# Patient Record
Sex: Male | Born: 1950
Health system: Southern US, Community
[De-identification: ages and names within clinical notes are randomized; demographics above are authoritative.]

## PROBLEM LIST (undated history)

## (undated) DIAGNOSIS — I10 Essential (primary) hypertension: Secondary | ICD-10-CM

## (undated) DIAGNOSIS — Z960 Presence of urogenital implants: Secondary | ICD-10-CM

## (undated) DIAGNOSIS — E7800 Pure hypercholesterolemia, unspecified: Secondary | ICD-10-CM

## (undated) HISTORY — PX: BRAIN SURGERY: SHX531

## (undated) HISTORY — PX: PROSTATE SURGERY: SHX751

## (undated) HISTORY — PX: OTHER SURGICAL HISTORY: SHX169

## (undated) HISTORY — PX: KIDNEY SURGERY: SHX687

---

## 1998-07-09 ENCOUNTER — Encounter: Admission: RE | Admit: 1998-07-09 | Discharge: 1998-10-07 | Payer: Self-pay | Admitting: Emergency Medicine

## 2001-01-07 ENCOUNTER — Ambulatory Visit (HOSPITAL_COMMUNITY): Admission: RE | Admit: 2001-01-07 | Discharge: 2001-01-07 | Payer: Self-pay | Admitting: Gastroenterology

## 2001-05-20 ENCOUNTER — Encounter: Payer: Self-pay | Admitting: Gastroenterology

## 2001-05-20 ENCOUNTER — Encounter: Admission: RE | Admit: 2001-05-20 | Discharge: 2001-05-20 | Payer: Self-pay | Admitting: Gastroenterology

## 2003-02-16 ENCOUNTER — Encounter (INDEPENDENT_AMBULATORY_CARE_PROVIDER_SITE_OTHER): Payer: Self-pay | Admitting: Specialist

## 2003-02-16 ENCOUNTER — Inpatient Hospital Stay (HOSPITAL_COMMUNITY): Admission: RE | Admit: 2003-02-16 | Discharge: 2003-02-18 | Payer: Self-pay | Admitting: Urology

## 2006-01-10 ENCOUNTER — Encounter (HOSPITAL_COMMUNITY): Admission: RE | Admit: 2006-01-10 | Discharge: 2006-01-15 | Payer: Self-pay | Admitting: Urology

## 2006-11-02 ENCOUNTER — Ambulatory Visit: Admission: RE | Admit: 2006-11-02 | Discharge: 2007-01-25 | Payer: Self-pay | Admitting: Radiation Oncology

## 2006-12-15 ENCOUNTER — Emergency Department (HOSPITAL_COMMUNITY): Admission: EM | Admit: 2006-12-15 | Discharge: 2006-12-15 | Payer: Self-pay | Admitting: Emergency Medicine

## 2007-01-25 ENCOUNTER — Ambulatory Visit: Admission: RE | Admit: 2007-01-25 | Discharge: 2007-02-06 | Payer: Self-pay | Admitting: Radiation Oncology

## 2007-07-07 ENCOUNTER — Observation Stay (HOSPITAL_COMMUNITY): Admission: EM | Admit: 2007-07-07 | Discharge: 2007-07-08 | Payer: Self-pay | Admitting: Emergency Medicine

## 2007-10-17 ENCOUNTER — Encounter (HOSPITAL_COMMUNITY): Admission: RE | Admit: 2007-10-17 | Discharge: 2007-10-24 | Payer: Self-pay | Admitting: Urology

## 2008-07-17 ENCOUNTER — Emergency Department (HOSPITAL_COMMUNITY): Admission: EM | Admit: 2008-07-17 | Discharge: 2008-07-17 | Payer: Self-pay | Admitting: Emergency Medicine

## 2009-01-29 ENCOUNTER — Emergency Department (HOSPITAL_COMMUNITY): Admission: EM | Admit: 2009-01-29 | Discharge: 2009-01-29 | Payer: Self-pay | Admitting: Emergency Medicine

## 2010-04-11 ENCOUNTER — Other Ambulatory Visit: Payer: Self-pay | Admitting: Gastroenterology

## 2010-05-16 LAB — URINE MICROSCOPIC-ADD ON

## 2010-05-16 LAB — URINE CULTURE
Colony Count: NO GROWTH
Culture: NO GROWTH

## 2010-05-16 LAB — URINALYSIS, ROUTINE W REFLEX MICROSCOPIC
Bilirubin Urine: NEGATIVE
Glucose, UA: NEGATIVE mg/dL
Ketones, ur: NEGATIVE mg/dL
Specific Gravity, Urine: 1.005 (ref 1.005–1.030)
pH: 6.5 (ref 5.0–8.0)

## 2010-06-21 NOTE — H&P (Signed)
Nicholas Maldonado, NIEBLAS NO.:  [id number]   MEDICAL RECORD NO.:  [id number]          PATIENT TYPE:  INP   LOCATION:  3735                         FACILITY:  [facility]   PHYSICIAN:  Peter M. Swaziland, M.D.  DATE OF BIRTH:  Mar 25, 1950   DATE OF ADMISSION:  07/07/2007  DATE OF DISCHARGE:                              HISTORY & PHYSICAL   HISTORY OF PRESENT ILLNESS:  Mr. Misch is a pleasant 60 year old white  male with history of type 2 diabetes mellitus and hypercholesterolemia.  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  There was no other radiation.  He denied any nausea,  vomiting, diaphoresis, or shortness of breath.  His pain lasted over 30  minutes and then gradually subsided and resolved.  He is now pain free.  He has no known history of chest pain or cardiac disease.   PAST MEDICAL HISTORY:  1. Diabetes mellitus type 2 for 8 to 10 years.  2. Hypercholesterolemia.  3. History of prostate cancer, status post prostatectomy and radiation      therapy.  4. Status post removal of benign brain tumor in 1987.   MEDICATIONS:  Include Pravachol daily and Glucomet twice daily.  He has  taken Cymbalta in the past, but states he has not been taking it  recently due to constipation.   ALLERGIES:  He is allergic to [facility].   SOCIAL HISTORY:  The patient is married.  He has two children.  He works  as a Technical sales engineer.  He denies tobacco or alcohol use.   FAMILY HISTORY:  Father is in his 73s and has coronary disease.  Mother  has a history of angina.  He has a brother, 6 years younger than he is,  who is alive and well.   REVIEW OF SYSTEMS:  He has noticed some constipation recently.  He does  not exercise vigorously other than what he does at work.  He has been  trying to watch his  diet more carefully to see if he can control his  sugars better with diet.  Other review of systems are negative.   PHYSICAL EXAMINATION:  GENERAL:  The patient is a pleasant white male,  in no distress.  VITAL SIGNS:  Blood pressures 149/82, pulse 52 in sinus rhythm, his  respirations were 20, and sats are 100%.  HEENT:  He is normocephalic and atraumatic.  Pupils equal, round, and  reactive to light accommodation.  Extraocular movements were full.  Oropharynx is clear.  NECK:  Supple without JVD, adenopathy, thyromegaly, or bruits.  LUNGS:  Clear to auscultation and percussion.  CARDIAC:  Reveals a regular rate and rhythm without gallop, murmur, rub,  or click.  ABDOMEN:  Soft and nontender.  He has no masses or bruits.  Femoral  pedal pulses are 2+ and symmetric.  He has no edema, cyanosis, or  phlebitis.  NEUROLOGIC:  Nonfocal.   LABORATORY DATA:  Chest x-ray is normal.  ECG shows sinus bradycardia,  otherwise normal, this is unchanged from January 2005.  White count  6100, hemoglobin 14.8, hematocrit 42.3, and platelets 212,000.  Coags  were normal.  Sodium is 133, potassium 3.8, chloride 104, CO2 of 24, BUN  15, creatinine 1.13, and glucose of 269.  CPK-MB is 1.5.  Troponin is  less than 0.05.   IMPRESSION:  1. Prolonged chest pain, now resolved, symptoms are concerning for      unstable angina.  2. Diabetes mellitus type 2.  3. Hypercholesterolemia.  4. Bradycardia.  5. History of prostate cancer, status post prostatectomy.   PLAN:  We will admit the patient to telemetry for observation.  We will  obtain serial cardiac enzymes and ECG.  At present, we will treat with  aspirin, topical nitrates, and a proton pump inhibitor as well as subcu  Lovenox.  We will avoid beta-blockers due to his resting bradycardia.  We will determine further evaluation once we see how the patient  responds to his initial treatment and on his follow up cardiac enzymes.            ______________________________  Peter M. Swaziland, M.D.     PMJ/MEDQ  D:  07/07/2007  T:  07/08/2007  Job:  [id number]   cc:   Oley Balm. Georgina Pillion, M.D.

## 2010-06-21 NOTE — Discharge Summary (Signed)
NAMELAMARCUS, SPIRA NO.:  [id number]   MEDICAL RECORD NO.:  [id number]          PATIENT TYPE:  INP   LOCATION:  3735                         FACILITY:  [facility]   PHYSICIAN:  Peter M. Swaziland, M.D.  DATE OF BIRTH:  1950/08/26   DATE OF ADMISSION:  07/07/2007  DATE OF DISCHARGE:  07/08/2007                               DISCHARGE SUMMARY   DISCHARGE SUMMARY:  The patient was admitted for evaluation of chest  pain.  For details of his past medical history, social history, family  history and physical exam, please see admission history and physical.   HOSPITAL COURSE:  The patient was placed on topical nitrates, aspirin,  and Lovenox.  He had no recurrent chest pain during 24-hour hospital  stay.  He had no arrhythmias.  His serial cardiac enzymes were all  normal.  Serial ECGs were all normal.  He had slight elevation of his  transaminases with AST of 43, ALT of 55, cholesterol is 154, LDL 77, HDL  30, triglycerides 236, blood sugars by CBG were elevated during his  hospital stay up in the 230 range.  The patient states that he had not  been taking his diabetic medication regularly since he is trying to  control it with diet alone.  It is recommended that he go back on his  diabetic medications as prescribed.  Given the patient's uneventful  hospital stay, recommended he be discharged to home and we will arrange  outpatient stress Cardiolite study this week.   DISCHARGE DIAGNOSES:  1. Chest pain.  2. Diabetes mellitus type 2.  3. Dyslipidemia.   DISCHARGE MEDICATIONS:  We will continue his diabetic medication as  previously prescribed.  The patient was unclear of the medication felt  that it was Glucomet that he took twice a day.  He is also on a dose of  Pravachol and will continue the same.  We will prescribe Prilosec over-  the-counter on a daily basis, nitroglycerin 0.4 mg sublingual p.r.n.,  coated aspirin daily.  We will arrange for outpatient stress  Cardiolite  study with Dr. Swaziland this week.   DISCHARGE STATUS:  Improved.           ______________________________  Peter M. Swaziland, M.D.     PMJ/MEDQ  D:  07/08/2007  T:  07/08/2007  Job:  [id number]   cc:   Oley Balm. Georgina Pillion, M.D.

## 2010-06-24 NOTE — Discharge Summary (Signed)
NAME:  Nicholas Maldonado, Nicholas Maldonado                           ACCOUNT NO.:  [id number]   MEDICAL RECORD NO.:  [id number]                   PATIENT TYPE:  INP   LOCATION:  0362                                 FACILITY:  [facility]   PHYSICIAN:  Mark C. Vernie Ammons, M.D.               DATE OF BIRTH:  [birth year]   DATE OF ADMISSION:  02/16/2003  DATE OF DISCHARGE:  02/18/2003                                 DISCHARGE SUMMARY   PRINCIPAL DIAGNOSIS:  Adenocarcinoma of the prostate.   OTHER DIAGNOSIS:  Diabetes diet controlled.   MAJOR OPERATION:  Radical retropubic prostatectomy, bilateral pelvic lymph  node dissection.   DISPOSITION:  The patient is discharged home in stable, satisfactory and  improved condition with a Foley catheter indwelling.   FOLLOW UP:  One week for staple removal.   ACTIVITY:  No heavy lifting, straining, strenuous activity, up and down  stairs or driving in a car.   DIET:  Routine preadmission diabetic diet.   DISCHARGE MEDICATIONS:  1. Tylox 1-2 q. 4h. p.r.n. pain #40.  2. Cipro 500 mg q.d. #14.   BRIEF HISTORY:  The patient is a 60 year old white male who has a history of  an elevated PSA and a prior negative biopsy.  His PSA had risen further to 9  with a low ratio of 11%.  He underwent biopsy which revealed adenocarcinoma  and a Gleason score of 7 (4+3) in less than 10% of the right lobe of the  prostate.  With clinical stage T1C cancer.  We discussed all treatment  options and he has elected to proceed with radical prostatectomy.  He is  admitted for that electively at this time.   The remainder of his history and physical is dictated and noted in his  hospital chart and will not be repeated here.   HOSPITAL COURSE:  On February 16, 2003, the patient underwent a radical  retropubic prostatectomy and lymph node dissection without complications.  He did have approximately 3 liters blood loss and required 2 units of packed  cells intraoperatively.  After that his  hemoglobin remained stable.  He had  no orthostasis, tachycardia or other difficulty.   The night of his surgery, he appeared to be groggy but doing well without  significant complaint.  The following day he began on a regular diet and  tolerated this well. He continues to do well and his IV was stopped as was  his PCA.  He was tolerating oral pain medication and his drain output fell  to approximately 30 mL per shift, it was therefore removed.   His pathology report was discussed with him.  It revealed bilaterally benign  lymph nodes and prostate cancer Gleason score 3+4 in both lobes but no  extraprostatic extension identified.  The apical margin appeared involved  with cancer; however, intraoperatively his apex was excellent therefore I  think that is artifactual; however, it  results in a pathologic stage T2C,  PN0, PMX.                                               Mark C. Vernie Ammons, M.D.    MCO/MEDQ  D:  02/18/2003  T:  02/18/2003  Job:  [id number]

## 2010-06-24 NOTE — Procedures (Signed)
[facility]  Patient:    Nicholas Maldonado, Nicholas Maldonado Visit Number: [id number] MRN: [id number]          Service Type: END Location: ENDO Attending Physician:  Orland Mustard Dictated by:   Llana Aliment. Randa Evens, M.D. Proc. Date: 01/07/01 Admit Date:  01/07/2001   CC:         Oley Balm. Georgina Pillion, M.D.   Procedure Report  PROCEDURE PERFORMED:  Colonoscopy.  ENDOSCOPIST:  Llana Aliment. Randa Evens, M.D.  MEDICATIONS USED:  Fentanyl 200 mcg, Versed 2 mg IV.  INSTRUMENT:  Olympus video colonoscope.  INDICATIONS:  Patient has family history of colon polyps.  This procedure was done to evaluate for any polyps.  DESCRIPTION OF PROCEDURE:  The procedure had been explained to the patient and consent obtained.  With the patient in the left lateral decubitus position, the adult Olympus video colonoscope was inserted and advanced under direct visualization.  The prep was excellent.  The patient had an extremely long tortuous colon.  Multiple maneuvers including position changes with the patient in the left lateral, supine and right lateral decubitus position were required and we were able to reach the hepatic flexure and pass down the ascending colon, but I was unable to pass completely down into the cecum. Although the right colon could be seen in the distance.  The patient had abdominal pressure which was uncomfortable and we just were simply not able to advance further due to the long length of his colon.  At this point I elected to terminate the procedure.  The scope was withdrawn and the ascending colon and hepatic flexure area, transverse colon, splenic flexure, descending and sigmoid colon were seen well upon withdrawal.  No polyps or other lesions were seen.  Scope withdrawn was withdrawn down to the rectum.  The rectum was free of polyps.  The patient tolerated the procedure well.  Maintained on low flow oxygen and pulse oximeter throughout the  procedure.  ASSESSMENT:  Essentially normal colonoscopy to the ascending colon.  No polyps  PLAN: 1. Routine postcolonoscopy precautions.  Watch for pain, bleeding, etc. 2. Will discussed arrangement of a barium enema after Christmas to look at the    right colon. 3. Will need a repeat attempted colonoscopy in five years or consideration of    a barium enema or sigmoid.  This will be discussed with him when we see him    back in January. Dictated by:   Llana Aliment. Randa Evens, M.D. Attending Physician:  Orland Mustard DD:  01/07/01 TD:  01/07/01 Job: [id number]

## 2010-06-24 NOTE — H&P (Signed)
NAME:  Nicholas Maldonado, Nicholas Maldonado                           ACCOUNT NO.:  [id number]   MEDICAL RECORD NO.:  [id number]                   PATIENT TYPE:  INP   LOCATION:  0009                                 [facility]ACILITY:  [facility]   PHYSICIAN:  Mark C. Vernie Ammons, M.D.               DATE OF BIRTH:  09/08/1950   DATE OF ADMISSION:  02/16/2003  DATE OF DISCHARGE:                                HISTORY & PHYSICAL   HISTORY OF PRESENT ILLNESS:  The patient is a 60 year old white male who has  been diagnosed with adenocarcinoma of the prostate Gleason 7 (4+3) involving  biopsies from the right side of his prostate.  He has a history of an  elevated PSA and in July of 2003 underwent a biopsy of his prostate.  His  PSA was 5.3.  A recheck of his PSA recently revealed elevation to 9 with a  free to total ratio of only 11.4%.  He has some mild obstructive symptoms  with nocturia x1, some frequency, but has no history of stones or other  urologic difficulty.  He is admitted for elective radical retropubic  prostatectomy at this time.   PAST MEDICAL HISTORY:  1. Diet controlled diabetes.  2. He had a colonoscopy in 2003 that was negative.   PAST SURGICAL HISTORY:  1. He had prostate biopsy in 2003.  2. In 1987 he had surgery for left acoustic neuroma.   MEDICATIONS:  1. Aspirin.  2. Tricor.  3. Vitamins.   He stopped his aspirin at this time.   ALLERGIES:  LAMISIL.  He has GI intolerance to [facility].   FAMILY HISTORY:  Positive for diabetes, hypertension, stroke.   SOCIAL HISTORY:  No tobacco or ethanol use.   REVIEW OF SYSTEMS:  No bone pain or weight loss.  He has mild obstructive  voiding complaints as noted above.  Otherwise, his review of systems is  negative.   PHYSICAL EXAMINATION:  GENERAL:  The patient is a well-developed, well-  nourished white male in no apparent distress.  VITAL SIGNS:  Pulse 70 and regular, respirations 20, blood pressure 141/91.  HEENT:  Atraumatic, normocephalic.   Oropharynx clear.  NECK:  Supple with midline trachea.  CHEST:  Normal respiratory effort.  Clear lung fields throughout.  CARDIOVASCULAR:  Regular rate and rhythm without murmurs, rubs, clicks, or  gallops.  ABDOMEN:  Soft and nontender without mass or HSM.  GENITOURINARY:  He had no inguinal hernias or adenopathy.  He has a normal  phallus, circumcised with normal glans meatus.  The scrotum, testicles,  epididymis, anus, and perineum are normal with normal rectal tone.  RECTAL:  His prostate is 1+ enlarged, smooth and flat, symmetric with no  suspicious nodularity or induration.  EXTREMITIES:  Without clubbing, cyanosis, edema and no deformity.  SKIN:  Warm and dry.  NEUROLOGIC:  He is alert and oriented with appropriate mood and affect and  no gross focal neurologic deficits.   LABORATORIES:  Chest x-ray reveals no evidence of acute disease.   EKG reveals normal sinus rhythm with no ST segment or T-wave changes.   Sodium 138, potassium 4.4, BUN 19, creatinine 1.4.  The remainder of his  electrolytes and liver function tests are normal.  His urinalysis is clear.  His white count is 5.1, hemoglobin 16.3, hematocrit 64.8, platelets 179,000.  PT 12.1, INR 0.9, PTT 30.   IMPRESSION:  1. Stage T1 C Gleason 7 (4+3) adenocarcinoma of the prostate positive from     the right lobe of his prostate with a PSA of 9.  The patient has had the     risks and complications as well as alternative therapies discussed.  He     has elected radical retropubic prostatectomy using nerve sparing     technique.  2. Diet controlled diabetes mellitus.   PLAN:  1. Routine deep venous thrombosis prophylaxis with PAS and ted hose.  2. Perioperative antibiotics.  3. Radical retropubic prostatectomy, bilateral pelvic lymph node dissection.                                               Mark C. Vernie Ammons, M.D.    MCO/MEDQ  D:  02/16/2003  T:  02/16/2003  Job:  [id number]

## 2010-06-24 NOTE — Op Note (Signed)
NAME:  Nicholas Maldonado, BLANK                           ACCOUNT NO.:  [id number]   MEDICAL RECORD NO.:  [id number]                   PATIENT TYPE:  INP   LOCATION:  0009                                 FACILITY:  [facility]   PHYSICIAN:  Mark C. Vernie Ammons, M.D.               DATE OF BIRTH:  [birth year]   DATE OF PROCEDURE:  02/16/2003  DATE OF DISCHARGE:                                 OPERATIVE REPORT   PREOPERATIVE DIAGNOSIS:  Adenocarcinoma of the prostate.   POSTOPERATIVE DIAGNOSIS:  Adenocarcinoma of the prostate.   OPERATION/PROCEDURE:  Radical retropubic prostatectomy and bilateral pelvic  lymph node dissection.   SURGEON:  Mark C. Vernie Ammons, M.D.   ASSISTANT:  Valetta Fuller, M.D.   ANESTHESIA:  General endotracheal anesthesia.   SPECIMENS:  Prostate as well as right and left pelvic lymph nodes to  pathology.   ESTIMATED BLOOD LOSS:  3000 mL.   TRANSFUSIONS:  Two units packed red blood cells.   DRAINS:  1. #10 flat Blake drain in the pelvis.  2. 20-French Foley catheter per urethra.   COMPLICATIONS:  None.   INDICATIONS:  The patient is a 60 year old white male with biopsy-proven  adenocarcinoma of the prostate.  HIS PSA is 9.  His Gleason's score is 7 (4  + 3) and his biopsy was positive from the right lobe of the prostate.  He is  brought to the OR for elective radical retropubic prostatectomy  understanding the risks, complications and alternatives.   DESCRIPTION OF PROCEDURE:  After informed consent, the patient was brought  to the major operating room and placed on the table and administered general  anesthesia in the supine position.  His lower abdomen and genitalia were  sterilely prepped and draped.  A 22-French Foley catheter was inserted in  the bladder filled with 30 mL.  A midline incision was then made from the  superior border of the symphysis pubis to below the umbilicus.  Incision was  carried down through the anterior rectus fascia.  The rectus muscle bellies  were parted in the midline and the retropubic space of Retzius was then  entered bluntly.  A Buchwalter retractor was then affixed to the table and  used for retraction and exposure.   First the left pelvic lymph nodes.  Pelvic lymph node dissection was  undertaken by incising along the anterior surface of the external iliac  vein, dissecting the nodal tissue off of the undersurface of the vein and  pelvic sidewall, clipping small vessels and lymphatics as they were  encountered.  The obturator nerve was identified and spared.  The nodal  tissue was completely dissected free.  The remaining attachments proximally  were clipped and specimen was sent for permanent section.  Retractor was  replaced to expose the right pelvic lymph nodes which were removed in  identical fashion.   The endopelvic fascia was then incised lateral to the  prostate.  The  puboprostatic ligaments were then incised flush with the undersurface of the  symphysis pubis and Hohenfellner clamp was then placed beneath the dorsal  vein complex anterior to the urethra.  The #1 Vicryl sutures was then used  to tie the dorsal vein complex.  A second tie was applied and then a 2-0  Vicryl suture ligature was then placed on the dorsal vein complex.  I then  incised the dorsal vein complex over the Hohenfellner which was placed  beneath this.  Minimal backbleeding was noted but the dorsal vein complex  was oversewn with a 2-0 chromic suture to afford excellent hemostasis.  I  then incised the anterior urethra, placed the first 12 o'clock 2-0 PDS  suture into the urethra and then placed the Hohenfellner clamp beneath the  urethra, pulled the catheter out through this incision and divided the  posterior urethra.  The Foley catheter was brought into the pelvis through  the urethral stump and clamped after being divided.  The Denonvilliers  fascia was then divided after placing a right-angle clamp beneath it.  This  then allowed the  plane between the prostate and rectum to be developed and I  then was able to isolate the prostatic pedicles with right-angle clamp  placing hemoclips on these and dividing them.  The right and left  neurovascular bundles were identified and spared during this procedure.  I  then incised the reflection of Denonvilliers fascia to expose some seminal  vesicles and ampulla of the vas.  The ampullas were dissected out, isolated,  clipped and divided.  The seminal vesicles were also dissected out and  clipped and divided at their tips.   Attention was directed at the bladder neck where the Allis clamps were  placed on the prostate and bladder neck and I then dissected this free with  using Vanderbilt down to the bladder neck which was divided over the  catheter.  The remaining attachments were cauterized and the specimen was  freed and sent to pathology.  They appeared to have good apex.  Next, I  everted the bladder neck mucosa with interrupted 4-0 chromic sutures.  The  bladder neck was then closed with running 2-0 chromic suture after  identifying the ureteral orifices well away from the bladder neck.   The anastomosis was then undertaken by placing Greenwald retractor in the  urethra and 2-0 PDS sutures were then placed at the 5, 7, 2, and 10 o'clock  positions in the bladder neck and urethra respectively.  The Foley catheter  was then passed per urethra into the bladder neck and filled with 15 mL of  water after which the 12 o'clock suture which had been placed previously was  then placed in the bladder neck.  The retractor was removed and the bladder  neck pulled down to opposition to the urethra stump and each of the bladder  neck sutures were individually tied.  The bladder was irrigated with saline  and there appeared to be no extravasation and clear return of irrigant.  A stab incision was made in the left lower quadrant and the pelvic drain  brought out through this and secured to  the skin with 2-0 subsuture.  This  was placed in the deep pelvis and the incision was closed by reapproximating  the rectus fascia with running #1 PDS suture.  Subcutaneous tissue was  irrigated and the skin was closed with skin staples.  The patient tolerated  the procedure well.  There were no intraoperative complications.  Needle,  sponge and instrument counts were reportedly correct x2 at the end of the  operation.                                               Mark C. Vernie Ammons, M.D.    MCO/MEDQ  D:  02/16/2003  T:  02/16/2003  Job:  [id number]

## 2010-11-02 LAB — BASIC METABOLIC PANEL
BUN: 15
CO2: 24
Calcium: 8.8
Chloride: 104
Creatinine, Ser: 1.13
GFR calc Af Amer: >60
GFR calc non Af Amer: >60
Glucose, Bld: 269 — ABNORMAL HIGH
Potassium: 3.8
Sodium: 133 — ABNORMAL LOW

## 2010-11-02 LAB — CK TOTAL AND CKMB (NOT AT ARMC)
CK, MB: 3
Relative Index: 1
Total CK: 303 — ABNORMAL HIGH

## 2010-11-02 LAB — CBC
HCT: 42.3
HCT: 44.2
Hemoglobin: 14.8
Hemoglobin: 15
MCHC: 34.1
MCHC: 35
MCV: 85.8
MCV: 86.6
Platelets: 212
RBC: 4.93
RBC: 5.1
RDW: 13
WBC: 6.1

## 2010-11-02 LAB — DIFFERENTIAL
Basophils Absolute: 0
Basophils Relative: 1
Eosinophils Absolute: 0.1
Eosinophils Relative: 2
Lymphocytes Relative: 23
Lymphs Abs: 1.4
Monocytes Absolute: 0.5
Monocytes Relative: 8
Neutro Abs: 4.1
Neutrophils Relative %: 67

## 2010-11-02 LAB — COMPREHENSIVE METABOLIC PANEL
ALT: 55 — ABNORMAL HIGH
AST: 43 — ABNORMAL HIGH
Albumin: 3.8
Alkaline Phosphatase: 69
BUN: 15
CO2: 24
Calcium: 8.9
Chloride: 103
Creatinine, Ser: 1.1
GFR calc Af Amer: >60
GFR calc non Af Amer: >60
Glucose, Bld: 263 — ABNORMAL HIGH
Potassium: 3.7
Sodium: 134 — ABNORMAL LOW
Total Bilirubin: 1.1
Total Protein: 6.2

## 2010-11-02 LAB — POCT CARDIAC MARKERS: Myoglobin, poc: 114

## 2010-11-02 LAB — PROTIME-INR
INR: 1
Prothrombin Time: 13

## 2010-11-02 LAB — APTT: aPTT: 29

## 2010-11-02 LAB — TROPONIN I: Troponin I: 0.01

## 2010-11-03 LAB — LIPID PANEL
Cholesterol: 154
HDL: 30 — ABNORMAL LOW
LDL Cholesterol: 77
Total CHOL/HDL Ratio: 5.1

## 2010-11-03 LAB — CARDIAC PANEL(CRET KIN+CKTOT+MB+TROPI): CK, MB: 1.8

## 2011-05-10 ENCOUNTER — Encounter (HOSPITAL_COMMUNITY): Payer: Self-pay

## 2011-05-10 ENCOUNTER — Emergency Department (HOSPITAL_COMMUNITY)
Admission: EM | Admit: 2011-05-10 | Discharge: 2011-05-10 | Disposition: A | Discharge status: Home / Self Care | Payer: Worker's Compensation | Attending: Emergency Medicine | Admitting: Emergency Medicine

## 2011-05-10 DIAGNOSIS — IMO0002 Reserved for concepts with insufficient information to code with codable children: Secondary | ICD-10-CM | POA: Insufficient documentation

## 2011-05-10 DIAGNOSIS — Y990 Civilian activity done for income or pay: Secondary | ICD-10-CM | POA: Insufficient documentation

## 2011-05-10 DIAGNOSIS — R040 Epistaxis: Secondary | ICD-10-CM | POA: Insufficient documentation

## 2011-05-10 DIAGNOSIS — S0003XA Contusion of scalp, initial encounter: Secondary | ICD-10-CM | POA: Insufficient documentation

## 2011-05-10 DIAGNOSIS — Z794 Long term (current) use of insulin: Secondary | ICD-10-CM | POA: Insufficient documentation

## 2011-05-10 DIAGNOSIS — E119 Type 2 diabetes mellitus without complications: Secondary | ICD-10-CM | POA: Insufficient documentation

## 2011-05-10 MED ORDER — OXYMETAZOLINE HCL 0.05 % NA SOLN
2.0000 | Freq: Once | NASAL | Status: AC
  Administered 2011-05-10: 2 via NASAL
  Filled 2011-05-10: qty 15

## 2011-05-10 NOTE — ED Provider Notes (Signed)
History     CSN: [id number]  Arrival date & time 05/10/11  1133   First MD Initiated Contact with Patient 05/10/11 1158      Chief Complaint  Patient presents with  . Facial Injury    (Consider location/radiation/quality/duration/timing/severity/associated sxs/prior treatment) HPI Comments: Patient here after hitting the tip of his nose while trying to inspect a pipe and the pipe "flicked my nose upward" - states bleeding from the right nare only - reports none from left - denies fever, chills, reports no bleeding at this time - no dizziness or shortness of breath.  Patient is a 61 y.o. male presenting with facial injury. The history is provided by the patient. No language interpreter was used.  Facial Injury  The incident occurred just prior to arrival. The incident occurred at work. The injury mechanism was a direct blow. The injury was related to work. The wounds were self-inflicted. No protective equipment was used. There is an injury to the nose. The pain is mild. It is unlikely that a foreign body is present. There is no possibility that he inhaled smoke. Pertinent negatives include no chest pain, no numbness, no visual disturbance, no abdominal pain, no bowel incontinence, no nausea, no vomiting, no bladder incontinence, no headaches, no hearing loss, no inability to bear weight, no neck pain, no pain when bearing weight, no focal weakness, no decreased responsiveness, no light-headedness, no loss of consciousness, no seizures, no tingling, no weakness, no cough, no difficulty breathing and no memory loss. There have been no prior injuries to these areas. His tetanus status is UTD. He has been behaving normally. There were no sick contacts. He has received no recent medical care.    Past Medical History  Diagnosis Date  . Diabetes mellitus     Past Surgical History  Procedure Date  . Kidney surgery   . Aus Artificial urinary sphincter    History reviewed. No pertinent family  history.  History  Substance Use Topics  . Smoking status: Never Smoker   . Smokeless tobacco: Not on file  . Alcohol Use: Yes      Review of Systems  Constitutional: Negative for decreased responsiveness.  HENT: Positive for nosebleeds. Negative for hearing loss, congestion, rhinorrhea and neck pain.   Eyes: Negative for visual disturbance.  Respiratory: Negative for cough.   Cardiovascular: Negative for chest pain.  Gastrointestinal: Negative for nausea, vomiting, abdominal pain and bowel incontinence.  Genitourinary: Negative for bladder incontinence.  Neurological: Negative for tingling, focal weakness, seizures, loss of consciousness, weakness, light-headedness, numbness and headaches.  Psychiatric/Behavioral: Negative for memory loss.  All other systems reviewed and are negative.    Allergies  Lamisil and Other  Home Medications   Current Outpatient Rx  Name Route Sig Dispense Refill  . RISAQUAD PO CAPS Oral Take 1 capsule by mouth daily.    Marland Kitchen CRANBERRY 300 MG PO TABS Oral Take 300 mg by mouth 2 (two) times daily.    Marland Kitchen VITAMIN B 12 PO Oral Take 1 tablet by mouth daily.    . INSULIN GLARGINE 100 UNIT/ML [place] SOLN Subcutaneous Inject 60 Units into the skin daily.    . ADULT MULTIVITAMIN W/MINERALS CH Oral Take 1 tablet by mouth daily.    Marland Kitchen FISH OIL 1000 MG PO CAPS Oral Take 2,000 mg by mouth daily.    Marland Kitchen RANITIDINE HCL 150 MG PO TABS Oral Take 150 mg by mouth daily.      BP 153/81  Pulse 108  Temp(Src)  97.9 F (36.6 C) (Oral)  Resp 20  SpO2 96%  Physical Exam  Nursing note and vitals reviewed. Constitutional: He is oriented to person, place, and time. He appears well-developed and well-nourished. No distress.  HENT:  Head: Normocephalic and atraumatic.  Right Ear: External ear normal.  Left Ear: External ear normal.  Nose: Nasal septal hematoma present. No nasal deformity or septal deviation. No epistaxis.  Mouth/Throat: No oropharyngeal exudate.       Right  medial septal hematoma noted.  Eyes: Conjunctivae are normal. Pupils are equal, round, and reactive to light. No scleral icterus.  Neck: Normal range of motion. Neck supple.  Cardiovascular: Normal rate, regular rhythm and normal heart sounds.  Exam reveals no gallop and no friction rub.   No murmur heard. Pulmonary/Chest: Effort normal and breath sounds normal. No respiratory distress. He has no wheezes. He has no rales. He exhibits no tenderness.  Abdominal: Soft. Bowel sounds are normal. He exhibits no distension.  Musculoskeletal: Normal range of motion. He exhibits no edema and no tenderness.  Lymphadenopathy:    He has no cervical adenopathy.  Neurological: He is alert and oriented to person, place, and time. No cranial nerve deficit.  Skin: Skin is warm and dry. No rash noted. No erythema. No pallor.  Psychiatric: He has a normal mood and affect. His behavior is normal. Judgment and thought content normal.    ED Course  Procedures (including critical care time)  Labs Reviewed - No data to display No results found.   Epistaxis   MDM  Patient with transient epistaxis from the right nare with septal hematoma - given afrin spray and instructed about how to stop any further bleeds - as well as instructed in using vasoline for the moistening of the nasal passages.        Izola Price [place], [place] 05/10/11 1240

## 2011-05-10 NOTE — ED Notes (Signed)
Patient presents to ED for further evaluation of a nose injury at work today. Patient reports he was standing up and hit his nose against a metal pipe which "flicked my nose upward"  Nose appeared to be bleeding PTA but very minimal bleeding noted without deformity.. Patient is not on blood thinners and denies LOC.

## 2011-05-10 NOTE — Discharge Instructions (Signed)

## 2011-05-13 NOTE — ED Provider Notes (Signed)
Medical screening examination/treatment/procedure(s) were conducted as a shared visit with non-physician practitioner(s) and myself.  I personally evaluated the patient during the encounter Blood clot present in the nare without active bleeding.  No other injury and due to bleeding being controlled and no anticoagulation do not feel pt needs packing.   Gwyneth Sprout, MD 05/13/11 2052

## 2011-05-15 ENCOUNTER — Other Ambulatory Visit: Payer: Self-pay

## 2011-05-15 ENCOUNTER — Other Ambulatory Visit: Payer: Self-pay | Admitting: Family Medicine

## 2011-05-15 DIAGNOSIS — N189 Chronic kidney disease, unspecified: Secondary | ICD-10-CM

## 2011-05-15 DIAGNOSIS — R339 Retention of urine, unspecified: Secondary | ICD-10-CM

## 2011-07-07 ENCOUNTER — Emergency Department (HOSPITAL_BASED_OUTPATIENT_CLINIC_OR_DEPARTMENT_OTHER): Payer: 59

## 2011-07-07 ENCOUNTER — Encounter (HOSPITAL_BASED_OUTPATIENT_CLINIC_OR_DEPARTMENT_OTHER): Payer: Self-pay | Admitting: *Deleted

## 2011-07-07 ENCOUNTER — Emergency Department (HOSPITAL_BASED_OUTPATIENT_CLINIC_OR_DEPARTMENT_OTHER)
Admission: EM | Admit: 2011-07-07 | Discharge: 2011-07-08 | Disposition: A | Discharge status: Other Acute Inpatient Hospital | Payer: 59 | Attending: Emergency Medicine | Admitting: Emergency Medicine

## 2011-07-07 DIAGNOSIS — R110 Nausea: Secondary | ICD-10-CM | POA: Insufficient documentation

## 2011-07-07 DIAGNOSIS — K651 Peritoneal abscess: Secondary | ICD-10-CM | POA: Insufficient documentation

## 2011-07-07 DIAGNOSIS — Z79899 Other long term (current) drug therapy: Secondary | ICD-10-CM | POA: Insufficient documentation

## 2011-07-07 DIAGNOSIS — N19 Unspecified kidney failure: Secondary | ICD-10-CM | POA: Insufficient documentation

## 2011-07-07 DIAGNOSIS — N179 Acute kidney failure, unspecified: Secondary | ICD-10-CM

## 2011-07-07 DIAGNOSIS — E119 Type 2 diabetes mellitus without complications: Secondary | ICD-10-CM | POA: Insufficient documentation

## 2011-07-07 LAB — COMPREHENSIVE METABOLIC PANEL
Albumin: 3.5 g/dL (ref 3.5–5.2)
Alkaline Phosphatase: 70 U/L (ref 39–117)
BUN: 55 mg/dL — ABNORMAL HIGH (ref 6–23)
CO2: 20 mEq/L (ref 19–32)
Chloride: 105 mEq/L (ref 96–112)
Creatinine, Ser: 4.8 mg/dL — ABNORMAL HIGH (ref 0.50–1.35)
GFR calc Af Amer: 14 mL/min — ABNORMAL LOW (ref >90)
GFR calc non Af Amer: 12 mL/min — ABNORMAL LOW (ref >90)
Glucose, Bld: 131 mg/dL — ABNORMAL HIGH (ref 70–99)
Potassium: 4.5 mEq/L (ref 3.5–5.1)
Total Bilirubin: 0.3 mg/dL (ref 0.3–1.2)

## 2011-07-07 LAB — URINALYSIS, ROUTINE W REFLEX MICROSCOPIC
Glucose, UA: NEGATIVE mg/dL
Specific Gravity, Urine: 1.008 (ref 1.005–1.030)
Urobilinogen, UA: 0.2 mg/dL (ref 0.0–1.0)

## 2011-07-07 LAB — DIFFERENTIAL
Lymphs Abs: 1.8 10*3/uL (ref 0.7–4.0)
Monocytes Absolute: 0.8 10*3/uL (ref 0.1–1.0)
Monocytes Relative: 11 % (ref 3–12)
Neutro Abs: 4.3 10*3/uL (ref 1.7–7.7)
Neutrophils Relative %: 61 % (ref 43–77)

## 2011-07-07 LAB — CBC
HCT: 29.9 % — ABNORMAL LOW (ref 39.0–52.0)
Hemoglobin: 10 g/dL — ABNORMAL LOW (ref 13.0–17.0)
RBC: 3.65 MIL/uL — ABNORMAL LOW (ref 4.22–5.81)

## 2011-07-07 LAB — URINE MICROSCOPIC-ADD ON

## 2011-07-07 MED ORDER — SODIUM CHLORIDE 0.9 % IV SOLN
Freq: Once | INTRAVENOUS | Status: AC
Start: 2011-07-07 — End: 2011-07-08
  Administered 2011-07-07: 23:00:00 via INTRAVENOUS

## 2011-07-07 MED ORDER — MORPHINE SULFATE 4 MG/ML IJ SOLN
4.0000 mg | Freq: Once | INTRAMUSCULAR | Status: AC
  Administered 2011-07-07: 4 mg via INTRAVENOUS
  Filled 2011-07-07: qty 1

## 2011-07-07 MED ORDER — ONDANSETRON HCL 4 MG/2ML IJ SOLN
4.0000 mg | Freq: Once | INTRAMUSCULAR | Status: AC
  Administered 2011-07-07: 4 mg via INTRAVENOUS
  Filled 2011-07-07: qty 2

## 2011-07-07 NOTE — ED Notes (Signed)
Patient care assumed from Dr. Judd Lien at 11 PM. Patient has significant past medical history of bladder issues followed at [facility]. About 3 weeks ago was undergoing antifungal treatment which had to be stopped due to reported increasing creatinine. Patient is uncertain what his numbers were.  Most recently finished course of Cipro prescribed by primary care physician at [facility] family practice.  Doing well until about 6 days ago began feeling worn out and today developed lower abdominal and suprapubic pains and was referred here by his urologist for a CAT scan. Labs reviewed and has increased creatinine with acute renal failure. No obvious UTI. Urine culture sent. CT scan obtained without IV contrast to further evaluate. Recheck at 11:50 PM is feeling well without nausea and minimal discomfort. No history of renal failure otherwise. No other new medications.  Results for orders placed during the hospital encounter of 07/07/11  URINALYSIS, ROUTINE W REFLEX MICROSCOPIC      Component Value Range   Color, Urine YELLOW  YELLOW    APPearance CLEAR  CLEAR    Specific Gravity, Urine 1.008  1.005 - 1.030    pH 5.5  5.0 - 8.0    Glucose, UA NEGATIVE  NEGATIVE (mg/dL)   Hgb urine dipstick SMALL (*) NEGATIVE    Bilirubin Urine NEGATIVE  NEGATIVE    Ketones, ur NEGATIVE  NEGATIVE (mg/dL)   Protein, ur NEGATIVE  NEGATIVE (mg/dL)   Urobilinogen, UA 0.2  0.0 - 1.0 (mg/dL)   Nitrite NEGATIVE  NEGATIVE    Leukocytes, UA MODERATE (*) NEGATIVE   URINE MICROSCOPIC-ADD ON      Component Value Range   Squamous Epithelial / LPF RARE  RARE    WBC, UA 7-10  <3 (WBC/hpf)   RBC / HPF 3-6  <3 (RBC/hpf)   Bacteria, UA RARE  RARE   CBC      Component Value Range   WBC 7.1  4.0 - 10.5 (K/uL)   RBC 3.65 (*) 4.22 - 5.81 (MIL/uL)   Hemoglobin 10.0 (*) 13.0 - 17.0 (g/dL)   HCT 16.1 (*) 09.6 - 52.0 (%)   MCV 81.9  78.0 - 100.0 (fL)   MCH 27.4  26.0 - 34.0 (pg)   MCHC 33.4  30.0 - 36.0 (g/dL)   RDW 04.5 (*) 40.9 - 15.5 (%)   Platelets 281  150 - 400 (K/uL)  DIFFERENTIAL      Component Value Range   Neutrophils Relative 61  43 - 77 (%)   Neutro Abs 4.3  1.7 - 7.7 (K/uL)   Lymphocytes Relative 25  12 - 46 (%)   Lymphs Abs 1.8  0.7 - 4.0 (K/uL)   Monocytes Relative 11  3 - 12 (%)   Monocytes Absolute 0.8  0.1 - 1.0 (K/uL)   Eosinophils Relative 3  0 - 5 (%)   Eosinophils Absolute 0.2  0.0 - 0.7 (K/uL)   Basophils Relative 1  0 - 1 (%)   Basophils Absolute 0.0  0.0 - 0.1 (K/uL)  COMPREHENSIVE METABOLIC PANEL      Component Value Range   Sodium 138  135 - 145 (mEq/L)   Potassium 4.5  3.5 - 5.1 (mEq/L)   Chloride 105  96 - 112 (mEq/L)   CO2 20  19 - 32 (mEq/L)   Glucose, Bld 131 (*) 70 - 99 (mg/dL)   BUN 55 (*) 6 - 23 (mg/dL)   Creatinine, Ser 8.11 (*) 0.50 - 1.35 (mg/dL)   Calcium 9.4  8.4 - 91.4 (mg/dL)  Total Protein 7.5  6.0 - 8.3 (g/dL)   Albumin 3.5  3.5 - 5.2 (g/dL)   AST 14  0 - 37 (U/L)   ALT 10  0 - 53 (U/L)   Alkaline Phosphatase 70  39 - 117 (U/L)   Total Bilirubin 0.3  0.3 - 1.2 (mg/dL)   GFR calc non Af Amer 12 (*) >90 (mL/min)   GFR calc Af Amer 14 (*) >90 (mL/min)   Ct Abdomen Pelvis Wo Contrast  07/08/2011  *RADIOLOGY REPORT*  Clinical Data: Lower abdominal pain and nausea for 3 days.  History of artificial urinary sphincter placed 3 weeks ago.  History of prostate cancer post radiation.  Previous bladder surgery due to bladder rupture.  No IV contrast material used due to renal insufficiency with elevated creatinine.  CT ABDOMEN AND PELVIS WITHOUT CONTRAST  Technique:  Multidetector CT imaging of the abdomen and pelvis was performed following the standard protocol without intravenous contrast.  Comparison: 10/22/2007  Findings: Mild dependent atelectasis in the lung bases.  Focal density across the posterior urethra with surgical tubing and fluid filled reservoir consistent with history of surgery with replaced urethral sphincter.  The bladder wall is mildly thickened. There is bilateral  pyelocaliectasis and ureterectasis.  No bladder or ureteral stones suggest this is likely due to reflux or stasis. Fluid collection anterior to the bladder along the midline may represent postoperative fluid collection or abscess.  This collection measures about 7.2 x 5.4 x 6.6 cm.  No gas within the collection.  Additional fluid collection versus free fluid in the left pelvis lateral to the bladder and inferior to the sigmoid colon.  This collection measures about 7.4 x 1.6 cm.  This could represent abscess or postoperative fluid.  No gas in this collection.  There is diffuse thickening of the rectosigmoid wall with some edema in the mesentery.  This likely represents reactive inflammation.  No free air in the abdomen or pelvis.  The unenhanced appearance of the liver, spleen, gallbladder, pancreas, adrenal glands, abdominal aorta, and retroperitoneal lymph nodes is unremarkable.  The stomach, small bowel, and colon are not distended.  No free air or free fluid in the abdomen. Degenerative changes in the lumbar spine.    IMPRESSION: Postoperative changes in the pelvis.  Bilateral pyelocaliectasis and ureterectasis most likely due to reflux or stasis.  Loculated fluid collections in the pelvis could represent postoperative fluid collections or abscesses.  No free or loculated abdominal/pelvic gas collections.  Original Report Authenticated By: Marlon Pel, M.D.   1:24 AM CT reviewed and consult placed to Urology at [facility].   1:31 AM PT remains asymptomatic. D/w Dr. Yevonne Aline (Urology at [facility]) who recs transfer to [facility] ED, under Urology service, recs CD copy of CT scan and hold any ABx, place catheter if we have 12 F available and I am able, and [facility] will transfer.   2:02 AM  I spoke with Dr Scot Jun in the ED at [facility] and he accepts the PT in transfer there.    Sunnie Nielsen, MD 07/08/11 [phone]

## 2011-07-07 NOTE — ED Notes (Signed)
Lower abd pain, denies V/D. Had AUS sx 3 weeks ago at [facility]. Dr told him to come to ER to rule out an infection

## 2011-07-07 NOTE — ED Provider Notes (Addendum)
History     CSN: [id number]  Arrival date & time 07/07/11  2155   First MD Initiated Contact with Patient 07/07/11 2228      Chief Complaint  Patient presents with  . Abdominal Pain    (Consider location/radiation/quality/duration/timing/severity/associated sxs/prior treatment) HPI Comments: Patient has history of prostate cancer, treated with radiation which affected his urinary continence.  He had an automated urinary sphincter placed at [facility] earlier this year.  He suffered a bladder rupture which developed into an abscess and required drainage with jp drain.  He recently finished a course of cipro.  Now is having lower abd pains, frequent stools.  He feels how he felt with the bladder rupture in the past.  Was sent here by his urologist to rule out complications.  Patient is a 61 y.o. male presenting with abdominal pain. The history is provided by the patient.  Abdominal Pain The primary symptoms of the illness include abdominal pain and nausea. The primary symptoms of the illness do not include fever or vomiting. The current episode started yesterday. The onset of the illness was gradual. The problem has been gradually worsening.  The patient has had a change in bowel habit. Symptoms associated with the illness do not include chills.    Past Medical History  Diagnosis Date  . Diabetes mellitus     Past Surgical History  Procedure Date  . Kidney surgery   . Aus Artificial urinary sphincter    No family history on file.  History  Substance Use Topics  . Smoking status: Never Smoker   . Smokeless tobacco: Not on file  . Alcohol Use: Yes      Review of Systems  Constitutional: Negative for fever and chills.  Gastrointestinal: Positive for nausea and abdominal pain. Negative for vomiting.  All other systems reviewed and are negative.    Allergies  Other and Terbinafine hcl  Home Medications   Current Outpatient Rx  Name Route Sig Dispense Refill  .  RISAQUAD PO CAPS Oral Take 1 capsule by mouth daily.    Marland Kitchen CRANBERRY 300 MG PO TABS Oral Take 300 mg by mouth 2 (two) times daily.    Marland Kitchen VITAMIN B 12 PO Oral Take 1 tablet by mouth daily.    . INSULIN GLARGINE 100 UNIT/ML [place] SOLN Subcutaneous Inject 60 Units into the skin daily.    . ADULT MULTIVITAMIN W/MINERALS CH Oral Take 1 tablet by mouth daily.    Marland Kitchen FISH OIL 1000 MG PO CAPS Oral Take 2,000 mg by mouth daily.    Marland Kitchen RANITIDINE HCL 150 MG PO TABS Oral Take 150 mg by mouth daily.      BP 159/76  Pulse 80  Temp(Src) 97.7 F (36.5 C) (Oral)  Resp 18  SpO2 100%  Physical Exam  Nursing note and vitals reviewed. Constitutional: He is oriented to person, place, and time. He appears well-developed and well-nourished. No distress.       Appears uncomfortable.  HENT:  Head: Normocephalic and atraumatic.  Neck: Normal range of motion. Neck supple.  Cardiovascular: Normal rate and regular rhythm.   Pulmonary/Chest: Effort normal and breath sounds normal. No respiratory distress. He has no wheezes.  Abdominal: Soft.       He is ttp in the llq and rlq without rebound or guarding.  Bowel sounds are present.    Musculoskeletal: Normal range of motion.  Neurological: He is alert and oriented to person, place, and time.  Skin: Skin is warm and  dry. He is not diaphoretic.    ED Course  Procedures (including critical care time)  Labs Reviewed  URINALYSIS, ROUTINE W REFLEX MICROSCOPIC - Abnormal; Notable for the following:    Hgb urine dipstick SMALL (*)    Leukocytes, UA MODERATE (*)    All other components within normal limits  URINE MICROSCOPIC-ADD ON  CBC  DIFFERENTIAL  COMPREHENSIVE METABOLIC PANEL   No results found.   No diagnosis found.    MDM  I have ordered labs and a ct of the abdomen and pelvis to rule out abscess, diverticulitis, or other intra-abdominal pathology.  The care will be signed out to Dr. Dierdre Highman at shift change.        Geoffery Lyons, MD 07/07/11  2309  Patient care assumed and labs and CT scan reviewed. Patient transferred to [facility] for pelvic abscess and acute renal failure.  Sunnie Nielsen, MD 07/08/11 [phone]

## 2011-07-08 MED ORDER — VANCOMYCIN HCL IN DEXTROSE 1-5 GM/200ML-% IV SOLN
1000.0000 mg | Freq: Once | INTRAVENOUS | Status: AC
  Administered 2011-07-08: 1000 mg via INTRAVENOUS
  Filled 2011-07-08: qty 200

## 2011-07-08 MED ORDER — MORPHINE SULFATE 4 MG/ML IJ SOLN
4.0000 mg | Freq: Once | INTRAMUSCULAR | Status: AC
  Administered 2011-07-08: 4 mg via INTRAVENOUS

## 2011-07-08 MED ORDER — MORPHINE SULFATE 4 MG/ML IJ SOLN
INTRAMUSCULAR | Status: AC
  Administered 2011-07-08: 4 mg via INTRAVENOUS
  Filled 2011-07-08: qty 1

## 2011-07-08 MED ORDER — IOHEXOL 300 MG/ML  SOLN
18.0000 mL | INTRAMUSCULAR | Status: AC
  Administered 2011-07-07: 18 mL via ORAL

## 2011-07-08 MED ORDER — PIPERACILLIN-TAZOBACTAM 3.375 G IVPB
3.3750 g | Freq: Once | INTRAVENOUS | Status: DC
  Administered 2011-07-08: 3.375 g via INTRAVENOUS
  Filled 2011-07-08: qty 50

## 2011-07-08 NOTE — ED Notes (Addendum)
Attempted to contact Shanda Bumps, RN at [facility], spoke with hospital operator at [phone] , rn unavailable to take report at current time, will attempt again

## 2011-07-08 NOTE — ED Notes (Signed)
Report given to [facility] at [facility]

## 2011-07-08 NOTE — ED Notes (Signed)
Pt transferred to [facility] ER for evaluation by urologist. [facility] transported

## 2011-07-08 NOTE — ED Notes (Signed)
Call placed to [facility] pals line for urology on call

## 2011-07-08 NOTE — ED Notes (Signed)
Patient requires a 12 fr catheter for his surgical site. However, [facility] does not have in stock any 12 fr catheters.

## 2011-07-09 LAB — URINE CULTURE: Culture: NO GROWTH

## 2012-12-04 ENCOUNTER — Ambulatory Visit: Payer: Self-pay | Admitting: Podiatry

## 2015-02-05 ENCOUNTER — Other Ambulatory Visit: Payer: Self-pay | Admitting: Family Medicine

## 2015-02-05 DIAGNOSIS — Z136 Encounter for screening for cardiovascular disorders: Secondary | ICD-10-CM

## 2015-03-25 ENCOUNTER — Ambulatory Visit
Admission: RE | Admit: 2015-03-25 | Discharge: 2015-03-25 | Disposition: A | Discharge status: Home / Self Care | Payer: Managed Care, Other (non HMO) | Source: Ambulatory Visit | Attending: Family Medicine | Admitting: Family Medicine

## 2015-03-25 DIAGNOSIS — Z136 Encounter for screening for cardiovascular disorders: Secondary | ICD-10-CM

## 2015-06-12 ENCOUNTER — Emergency Department (HOSPITAL_BASED_OUTPATIENT_CLINIC_OR_DEPARTMENT_OTHER)
Admission: EM | Admit: 2015-06-12 | Discharge: 2015-06-12 | Disposition: A | Discharge status: Home / Self Care | Payer: Managed Care, Other (non HMO) | Attending: Emergency Medicine | Admitting: Emergency Medicine

## 2015-06-12 ENCOUNTER — Encounter (HOSPITAL_BASED_OUTPATIENT_CLINIC_OR_DEPARTMENT_OTHER): Payer: Self-pay | Admitting: *Deleted

## 2015-06-12 ENCOUNTER — Emergency Department (HOSPITAL_BASED_OUTPATIENT_CLINIC_OR_DEPARTMENT_OTHER): Payer: Managed Care, Other (non HMO)

## 2015-06-12 DIAGNOSIS — E119 Type 2 diabetes mellitus without complications: Secondary | ICD-10-CM | POA: Insufficient documentation

## 2015-06-12 DIAGNOSIS — I10 Essential (primary) hypertension: Secondary | ICD-10-CM | POA: Diagnosis not present

## 2015-06-12 DIAGNOSIS — M545 Low back pain, unspecified: Secondary | ICD-10-CM

## 2015-06-12 DIAGNOSIS — Z79899 Other long term (current) drug therapy: Secondary | ICD-10-CM | POA: Insufficient documentation

## 2015-06-12 DIAGNOSIS — Z7984 Long term (current) use of oral hypoglycemic drugs: Secondary | ICD-10-CM | POA: Diagnosis not present

## 2015-06-12 DIAGNOSIS — Z794 Long term (current) use of insulin: Secondary | ICD-10-CM | POA: Insufficient documentation

## 2015-06-12 HISTORY — DX: Presence of urogenital implants: Z96.0

## 2015-06-12 HISTORY — DX: Essential (primary) hypertension: I10

## 2015-06-12 HISTORY — DX: Pure hypercholesterolemia, unspecified: E78.00

## 2015-06-12 MED ORDER — METRONIDAZOLE 500 MG PO TABS: 500.0000 mg | ORAL_TABLET | Freq: Once | ORAL | Status: DC

## 2015-06-12 MED ORDER — METHOCARBAMOL 500 MG PO TABS
1000.0000 mg | ORAL_TABLET | Freq: Once | ORAL | Status: AC
  Administered 2015-06-12: 1000 mg via ORAL

## 2015-06-12 MED ORDER — METHOCARBAMOL 500 MG PO TABS
ORAL_TABLET | ORAL | Status: DC
Start: 2015-06-12 — End: 2015-06-13
  Filled 2015-06-12: qty 2

## 2015-06-12 MED ORDER — PREDNISONE 50 MG PO TABS
60.0000 mg | ORAL_TABLET | Freq: Once | ORAL | Status: AC
  Administered 2015-06-12: 60 mg via ORAL
  Filled 2015-06-12: qty 1

## 2015-06-12 MED ORDER — PREDNISONE 20 MG PO TABS: 20.0000 mg | ORAL_TABLET | Freq: Two times a day (BID) | ORAL | Status: DC

## 2015-06-12 MED ORDER — METHOCARBAMOL 500 MG PO TABS: 500.0000 mg | ORAL_TABLET | Freq: Three times a day (TID) | ORAL | Status: DC | PRN

## 2015-06-12 MED ORDER — HYDROCODONE-ACETAMINOPHEN 5-325 MG PO TABS: 2.0000 | ORAL_TABLET | ORAL | Status: DC | PRN

## 2015-06-12 MED ORDER — ONDANSETRON 4 MG PO TBDP
4.0000 mg | ORAL_TABLET | Freq: Once | ORAL | Status: AC
  Administered 2015-06-12: 4 mg via ORAL
  Filled 2015-06-12: qty 1

## 2015-06-12 MED ORDER — MORPHINE SULFATE (PF) 10 MG/ML IV SOLN
10.0000 mg | Freq: Once | INTRAVENOUS | Status: AC
  Administered 2015-06-12: 10 mg via INTRAMUSCULAR
  Filled 2015-06-12: qty 1

## 2015-06-12 MED ORDER — HYDROCODONE-ACETAMINOPHEN 5-325 MG PO TABS
2.0000 | ORAL_TABLET | Freq: Once | ORAL | Status: AC
  Administered 2015-06-12: 2 via ORAL
  Filled 2015-06-12: qty 2

## 2015-06-12 NOTE — Discharge Instructions (Signed)
Supine, next 48 hours, then slowly increase upright activity.\ PCP by Wednesday if not  Improving. ER with any worsening, or new symptoms.  Back Pain, Adult Back pain is very common in adults.The cause of back pain is rarely dangerous and the pain often gets better over time.The cause of your back pain may not be known. Some common causes of back pain include:  Strain of the muscles or ligaments supporting the spine.  Wear and tear (degeneration) of the spinal disks.  Arthritis.  Direct injury to the back. For many people, back pain may return. Since back pain is rarely dangerous, most people can learn to manage this condition on their own. HOME CARE INSTRUCTIONS Watch your back pain for any changes. The following actions may help to lessen any discomfort you are feeling:  Remain active. It is stressful on your back to sit or stand in one place for long periods of time. Do not sit, drive, or stand in one place for more than 30 minutes at a time. Take short walks on even surfaces as soon as you are able.Try to increase the length of time you walk each day.  Exercise regularly as directed by your health care provider. Exercise helps your back heal faster. It also helps avoid future injury by keeping your muscles strong and flexible.  Do not stay in bed.Resting more than 1-2 days can delay your recovery.  Pay attention to your body when you bend and lift. The most comfortable positions are those that put less stress on your recovering back. Always use proper lifting techniques, including:  Bending your knees.  Keeping the load close to your body.  Avoiding twisting.  Find a comfortable position to sleep. Use a firm mattress and lie on your side with your knees slightly bent. If you lie on your back, put a pillow under your knees.  Avoid feeling anxious or stressed.Stress increases muscle tension and can worsen back pain.It is important to recognize when you are anxious or  stressed and learn ways to manage it, such as with exercise.  Take medicines only as directed by your health care provider. Over-the-counter medicines to reduce pain and inflammation are often the most helpful.Your health care provider may prescribe muscle relaxant drugs.These medicines help dull your pain so you can more quickly return to your normal activities and healthy exercise.  Apply ice to the injured area:  Put ice in a plastic bag.  Place a towel between your skin and the bag.  Leave the ice on for 20 minutes, 2-3 times a day for the first 2-3 days. After that, ice and heat may be alternated to reduce pain and spasms.  Maintain a healthy weight. Excess weight puts extra stress on your back and makes it difficult to maintain good posture. SEEK MEDICAL CARE IF:  You have pain that is not relieved with rest or medicine.  You have increasing pain going down into the legs or buttocks.  You have pain that does not improve in one week.  You have night pain.  You lose weight.  You have a fever or chills. SEEK IMMEDIATE MEDICAL CARE IF:   You develop new bowel or bladder control problems.  You have unusual weakness or numbness in your arms or legs.  You develop nausea or vomiting.  You develop abdominal pain.  You feel faint.   This information is not intended to replace advice given to you by your health care provider. Make sure you discuss any questions  you have with your health care provider.   Document Released: 01/23/2005 Document Revised: 02/13/2014 Document Reviewed: 05/27/2013 Elsevier Interactive Patient Education [employer].

## 2015-06-12 NOTE — ED Provider Notes (Signed)
CSN: [id number]     Arrival date & time 06/12/15  2152 History  By signing my name below, I, [facility], attest that this documentation has been prepared under the direction and in the presence of Tanna Furry, MD. Electronically Signed: Virgel Bouquet, ED Scribe. 06/12/2015. 10:33 PM.    Chief Complaint  Patient presents with  . Back Pain   The history is provided by the patient. No language interpreter was used.   HPI Comments: Nicholas Maldonado is a 65 y.o. male with an hx of DM, HTN, hypercholesterolemia who presents to the Emergency Department complaining of constant, mild, non-radiating lower back pain onset earlier this morning. Pt states that he was getting off a swivel bar stool when he twisted his back and felt a pull, followed immediately by pain. Pain is worse with movement. He reports a hx of stage 1 renal disorder (secondary to DM) and prostate cancer that was treated 30 years ago but states that his PSA levels have been elevated recently. Denies similar symptoms in the past. Denies any other symptoms currently.  Past Medical History  Diagnosis Date  . Diabetes mellitus   . Hypertension   . Hypercholesterolemia   . Has artificial bladder    Past Surgical History  Procedure Laterality Date  . Kidney surgery    . Aus  Artificial urinary sphincter  . Brain surgery    . Prostate surgery     History reviewed. No pertinent family history. Social History  Substance Use Topics  . Smoking status: Never Smoker   . Smokeless tobacco: None  . Alcohol Use: Yes    Review of Systems  Constitutional: Negative for fever, chills, diaphoresis, appetite change and fatigue.  HENT: Negative for mouth sores, sore throat and trouble swallowing.   Eyes: Negative for visual disturbance.  Respiratory: Negative for cough, chest tightness, shortness of breath and wheezing.   Cardiovascular: Negative for chest pain.  Gastrointestinal: Negative for nausea, vomiting, abdominal pain,  diarrhea and abdominal distention.  Endocrine: Negative for polydipsia, polyphagia and polyuria.  Genitourinary: Negative for dysuria, frequency and hematuria.  Musculoskeletal: Positive for back pain. Negative for gait problem.  Skin: Negative for color change, pallor and rash.  Neurological: Negative for dizziness, syncope, light-headedness and headaches.  Hematological: Does not bruise/bleed easily.  Psychiatric/Behavioral: Negative for behavioral problems and confusion.   Allergies  Levaquin; Other; Percocet; and Terbinafine hcl  Home Medications   Prior to Admission medications   Medication Sig Start Date End Date Taking? Authorizing Provider  insulin glargine (LANTUS SOLOSTAR) 100 UNIT/ML injection Inject 60 Units into the skin daily.   Yes Historical Provider, MD  metFORMIN (GLUCOPHAGE) 500 MG tablet Take 500 mg by mouth 2 (two) times daily with a meal.   Yes Historical Provider, MD  Multiple Vitamin (MULITIVITAMIN WITH MINERALS) TABS Take 1 tablet by mouth daily.   Yes Historical Provider, MD  ranitidine (ZANTAC) 150 MG tablet Take 150 mg by mouth daily.   Yes Historical Provider, MD  sildenafil (REVATIO) 20 MG tablet Take 20 mg by mouth 3 (three) times daily.   Yes Historical Provider, MD  simvastatin (ZOCOR) 20 MG tablet Take 20 mg by mouth daily.   Yes Historical Provider, MD  HYDROcodone-acetaminophen (NORCO/VICODIN) 5-325 MG tablet Take 2 tablets by mouth every 4 (four) hours as needed. 06/12/15   Tanna Furry, MD  methocarbamol (ROBAXIN) 500 MG tablet Take 1 tablet (500 mg total) by mouth 3 (three) times daily between meals as needed. 06/12/15  Tanna Furry, MD  predniSONE (DELTASONE) 20 MG tablet Take 1 tablet (20 mg total) by mouth 2 (two) times daily with a meal. 06/12/15   Tanna Furry, MD   BP 128/91 mmHg  Pulse 79  Temp(Src) 98.7 F (37.1 C) (Oral)  Resp 20  Ht 6' (1.829 m)  Wt 182 lb (82.555 kg)  BMI 24.68 kg/m2  SpO2 100% Physical Exam  Constitutional: He is oriented to  person, place, and time. He appears well-developed and well-nourished. No distress.  HENT:  Head: Normocephalic.  Eyes: Conjunctivae are normal. Pupils are equal, round, and reactive to light. No scleral icterus.  Neck: Normal range of motion. Neck supple. No thyromegaly present.  Cardiovascular: Normal rate and regular rhythm.  Exam reveals no gallop and no friction rub.   No murmur heard. Pulmonary/Chest: Effort normal and breath sounds normal. No respiratory distress. He has no wheezes. He has no rales.  Abdominal: Soft. Bowel sounds are normal. He exhibits no distension. There is no tenderness. There is no rebound.  Musculoskeletal: Normal range of motion. He exhibits tenderness.  Midline and paraspinal L-spine tenderness. Negative piriformis exam.   Neurological: He is alert and oriented to person, place, and time.  Skin: Skin is warm and dry. No rash noted.  Psychiatric: He has a normal mood and affect. His behavior is normal.    ED Course  Procedures   DIAGNOSTIC STUDIES: Oxygen Saturation is 100% on RA, normal by my interpretation.    COORDINATION OF CARE: 10:23 PM Will order prednisone, Robaxin, Zofran, morphine, and back x-ray. Discussed treatment plan with pt at bedside and pt agreed to plan.  Imaging Review Dg Lumbar Spine Complete  06/12/2015  CLINICAL DATA:  Low back pain, onset with a twisting injury this morning. EXAM: LUMBAR SPINE - COMPLETE 4+ VIEW COMPARISON:  None. FINDINGS: There is no evidence of lumbar spine fracture. Alignment is normal. Intervertebral disc spaces are maintained. IMPRESSION: Negative. Electronically Signed   By: Andreas Newport M.D.   On: 06/12/2015 23:19   I have personally reviewed and evaluated these images as part of my medical decision-making.   MDM   Final diagnoses:  Midline low back pain without sciatica    Normal x-ray showing no acute abnormalities in the lumbar spine  He is appropriate for discharge home. No symptoms or  findings of the suggest acute herniated nucleus or cauda equina. The symptoms were discussed with him and told to return immediately if any of these symptoms should occur. Follow-up with primary care physician by Wednesday if not improving. Primary survey equal. Next 48 hours be supine for 2324 hrs. upright heat to the bathroom only. Slowly increase activity. Prescription prednisone, Robaxin, Vicodin.    I personally performed the services described in this documentation, which was scribed in my presence. The recorded information has been reviewed and is accurate.    Tanna Furry, MD 06/12/15 2330

## 2015-06-12 NOTE — ED Notes (Signed)
Per pt report was sitting eating breakfast when turn to get off bar stool and hurt back. Been in pain since. Took motrin and muscle relaxants with no relief.

## 2016-02-09 DIAGNOSIS — E7800 Pure hypercholesterolemia, unspecified: Secondary | ICD-10-CM | POA: Diagnosis not present

## 2016-02-09 DIAGNOSIS — Z794 Long term (current) use of insulin: Secondary | ICD-10-CM | POA: Diagnosis not present

## 2016-02-09 DIAGNOSIS — F419 Anxiety disorder, unspecified: Secondary | ICD-10-CM | POA: Diagnosis not present

## 2016-02-09 DIAGNOSIS — F411 Generalized anxiety disorder: Secondary | ICD-10-CM | POA: Diagnosis not present

## 2016-02-09 DIAGNOSIS — Z0000 Encounter for general adult medical examination without abnormal findings: Secondary | ICD-10-CM | POA: Diagnosis not present

## 2016-02-09 DIAGNOSIS — Z23 Encounter for immunization: Secondary | ICD-10-CM | POA: Diagnosis not present

## 2016-02-09 DIAGNOSIS — I10 Essential (primary) hypertension: Secondary | ICD-10-CM | POA: Diagnosis not present

## 2016-02-09 DIAGNOSIS — E11319 Type 2 diabetes mellitus with unspecified diabetic retinopathy without macular edema: Secondary | ICD-10-CM | POA: Diagnosis not present

## 2016-02-09 DIAGNOSIS — Z8546 Personal history of malignant neoplasm of prostate: Secondary | ICD-10-CM | POA: Diagnosis not present

## 2016-02-11 DIAGNOSIS — E1122 Type 2 diabetes mellitus with diabetic chronic kidney disease: Secondary | ICD-10-CM | POA: Diagnosis not present

## 2016-02-11 DIAGNOSIS — E1165 Type 2 diabetes mellitus with hyperglycemia: Secondary | ICD-10-CM | POA: Diagnosis not present

## 2016-02-11 DIAGNOSIS — Z794 Long term (current) use of insulin: Secondary | ICD-10-CM | POA: Diagnosis not present

## 2016-02-11 DIAGNOSIS — E11319 Type 2 diabetes mellitus with unspecified diabetic retinopathy without macular edema: Secondary | ICD-10-CM | POA: Diagnosis not present

## 2016-02-14 DIAGNOSIS — N529 Male erectile dysfunction, unspecified: Secondary | ICD-10-CM | POA: Diagnosis not present

## 2016-02-14 DIAGNOSIS — Z923 Personal history of irradiation: Secondary | ICD-10-CM | POA: Diagnosis not present

## 2016-02-14 DIAGNOSIS — Z9079 Acquired absence of other genital organ(s): Secondary | ICD-10-CM | POA: Diagnosis not present

## 2016-02-14 DIAGNOSIS — Z08 Encounter for follow-up examination after completed treatment for malignant neoplasm: Secondary | ICD-10-CM | POA: Diagnosis not present

## 2016-02-14 DIAGNOSIS — N5231 Erectile dysfunction following radical prostatectomy: Secondary | ICD-10-CM | POA: Diagnosis not present

## 2016-02-14 DIAGNOSIS — Z9889 Other specified postprocedural states: Secondary | ICD-10-CM | POA: Diagnosis not present

## 2016-02-14 DIAGNOSIS — C61 Malignant neoplasm of prostate: Secondary | ICD-10-CM | POA: Diagnosis not present

## 2016-02-14 DIAGNOSIS — Z8546 Personal history of malignant neoplasm of prostate: Secondary | ICD-10-CM | POA: Diagnosis not present

## 2016-02-17 DIAGNOSIS — M25532 Pain in left wrist: Secondary | ICD-10-CM | POA: Diagnosis not present

## 2016-02-17 DIAGNOSIS — M65332 Trigger finger, left middle finger: Secondary | ICD-10-CM | POA: Diagnosis not present

## 2016-02-17 DIAGNOSIS — M65341 Trigger finger, right ring finger: Secondary | ICD-10-CM | POA: Diagnosis not present

## 2016-02-17 DIAGNOSIS — M25531 Pain in right wrist: Secondary | ICD-10-CM | POA: Diagnosis not present

## 2016-03-13 DIAGNOSIS — M65341 Trigger finger, right ring finger: Secondary | ICD-10-CM | POA: Diagnosis not present

## 2016-03-13 DIAGNOSIS — M65332 Trigger finger, left middle finger: Secondary | ICD-10-CM | POA: Diagnosis not present

## 2016-04-03 DIAGNOSIS — M7711 Lateral epicondylitis, right elbow: Secondary | ICD-10-CM | POA: Diagnosis not present

## 2016-04-03 DIAGNOSIS — M65341 Trigger finger, right ring finger: Secondary | ICD-10-CM | POA: Diagnosis not present

## 2016-05-22 DIAGNOSIS — M7711 Lateral epicondylitis, right elbow: Secondary | ICD-10-CM | POA: Diagnosis not present

## 2016-06-13 DIAGNOSIS — E1122 Type 2 diabetes mellitus with diabetic chronic kidney disease: Secondary | ICD-10-CM | POA: Diagnosis not present

## 2016-06-13 DIAGNOSIS — Z794 Long term (current) use of insulin: Secondary | ICD-10-CM | POA: Diagnosis not present

## 2016-06-13 DIAGNOSIS — E1165 Type 2 diabetes mellitus with hyperglycemia: Secondary | ICD-10-CM | POA: Diagnosis not present

## 2016-06-13 DIAGNOSIS — E11319 Type 2 diabetes mellitus with unspecified diabetic retinopathy without macular edema: Secondary | ICD-10-CM | POA: Diagnosis not present

## 2016-06-19 DIAGNOSIS — M7711 Lateral epicondylitis, right elbow: Secondary | ICD-10-CM | POA: Diagnosis not present

## 2016-07-17 DIAGNOSIS — M65341 Trigger finger, right ring finger: Secondary | ICD-10-CM | POA: Diagnosis not present

## 2016-07-17 DIAGNOSIS — M7711 Lateral epicondylitis, right elbow: Secondary | ICD-10-CM | POA: Diagnosis not present

## 2016-08-23 DIAGNOSIS — H2513 Age-related nuclear cataract, bilateral: Secondary | ICD-10-CM | POA: Diagnosis not present

## 2016-08-23 DIAGNOSIS — E119 Type 2 diabetes mellitus without complications: Secondary | ICD-10-CM | POA: Diagnosis not present

## 2016-09-26 DIAGNOSIS — Z5181 Encounter for therapeutic drug level monitoring: Secondary | ICD-10-CM | POA: Diagnosis not present

## 2016-09-26 DIAGNOSIS — Z794 Long term (current) use of insulin: Secondary | ICD-10-CM | POA: Diagnosis not present

## 2016-09-26 DIAGNOSIS — E11319 Type 2 diabetes mellitus with unspecified diabetic retinopathy without macular edema: Secondary | ICD-10-CM | POA: Diagnosis not present

## 2016-09-26 DIAGNOSIS — E1122 Type 2 diabetes mellitus with diabetic chronic kidney disease: Secondary | ICD-10-CM | POA: Diagnosis not present

## 2017-02-08 DIAGNOSIS — Z0001 Encounter for general adult medical examination with abnormal findings: Secondary | ICD-10-CM | POA: Diagnosis not present

## 2017-02-08 DIAGNOSIS — E7800 Pure hypercholesterolemia, unspecified: Secondary | ICD-10-CM | POA: Diagnosis not present

## 2017-02-08 DIAGNOSIS — I10 Essential (primary) hypertension: Secondary | ICD-10-CM | POA: Diagnosis not present

## 2017-02-08 DIAGNOSIS — F419 Anxiety disorder, unspecified: Secondary | ICD-10-CM | POA: Diagnosis not present

## 2017-02-08 DIAGNOSIS — E1122 Type 2 diabetes mellitus with diabetic chronic kidney disease: Secondary | ICD-10-CM | POA: Diagnosis not present

## 2017-02-08 DIAGNOSIS — C61 Malignant neoplasm of prostate: Secondary | ICD-10-CM | POA: Diagnosis not present

## 2017-02-08 DIAGNOSIS — Z79899 Other long term (current) drug therapy: Secondary | ICD-10-CM | POA: Diagnosis not present

## 2017-02-08 DIAGNOSIS — Z1159 Encounter for screening for other viral diseases: Secondary | ICD-10-CM | POA: Diagnosis not present

## 2017-03-07 DIAGNOSIS — N393 Stress incontinence (female) (male): Secondary | ICD-10-CM | POA: Diagnosis not present

## 2017-03-07 DIAGNOSIS — C61 Malignant neoplasm of prostate: Secondary | ICD-10-CM | POA: Diagnosis not present

## 2017-03-07 DIAGNOSIS — Z9079 Acquired absence of other genital organ(s): Secondary | ICD-10-CM | POA: Diagnosis not present

## 2017-03-07 DIAGNOSIS — Z923 Personal history of irradiation: Secondary | ICD-10-CM | POA: Diagnosis not present

## 2017-03-07 DIAGNOSIS — Z881 Allergy status to other antibiotic agents status: Secondary | ICD-10-CM | POA: Diagnosis not present

## 2017-03-07 DIAGNOSIS — Z885 Allergy status to narcotic agent status: Secondary | ICD-10-CM | POA: Diagnosis not present

## 2017-03-07 DIAGNOSIS — Z888 Allergy status to other drugs, medicaments and biological substances status: Secondary | ICD-10-CM | POA: Diagnosis not present

## 2017-03-07 DIAGNOSIS — N529 Male erectile dysfunction, unspecified: Secondary | ICD-10-CM | POA: Diagnosis not present

## 2017-07-09 DIAGNOSIS — J069 Acute upper respiratory infection, unspecified: Secondary | ICD-10-CM | POA: Diagnosis not present

## 2017-09-04 DIAGNOSIS — C61 Malignant neoplasm of prostate: Secondary | ICD-10-CM | POA: Diagnosis not present

## 2017-10-26 DIAGNOSIS — E11319 Type 2 diabetes mellitus with unspecified diabetic retinopathy without macular edema: Secondary | ICD-10-CM | POA: Diagnosis not present

## 2017-10-26 DIAGNOSIS — E1122 Type 2 diabetes mellitus with diabetic chronic kidney disease: Secondary | ICD-10-CM | POA: Diagnosis not present

## 2017-10-26 DIAGNOSIS — Z794 Long term (current) use of insulin: Secondary | ICD-10-CM | POA: Diagnosis not present

## 2017-10-26 DIAGNOSIS — Z79899 Other long term (current) drug therapy: Secondary | ICD-10-CM | POA: Diagnosis not present

## 2017-10-26 DIAGNOSIS — Z23 Encounter for immunization: Secondary | ICD-10-CM | POA: Diagnosis not present

## 2017-11-14 DIAGNOSIS — M25512 Pain in left shoulder: Secondary | ICD-10-CM | POA: Diagnosis not present

## 2017-11-14 DIAGNOSIS — M13812 Other specified arthritis, left shoulder: Secondary | ICD-10-CM | POA: Diagnosis not present

## 2017-11-28 DIAGNOSIS — F410 Panic disorder [episodic paroxysmal anxiety] without agoraphobia: Secondary | ICD-10-CM | POA: Diagnosis not present

## 2017-11-28 DIAGNOSIS — F419 Anxiety disorder, unspecified: Secondary | ICD-10-CM | POA: Diagnosis not present

## 2017-12-11 DIAGNOSIS — M25512 Pain in left shoulder: Secondary | ICD-10-CM | POA: Diagnosis not present

## 2017-12-11 DIAGNOSIS — M13812 Other specified arthritis, left shoulder: Secondary | ICD-10-CM | POA: Diagnosis not present

## 2017-12-11 DIAGNOSIS — M7502 Adhesive capsulitis of left shoulder: Secondary | ICD-10-CM | POA: Diagnosis not present

## 2017-12-26 DIAGNOSIS — F419 Anxiety disorder, unspecified: Secondary | ICD-10-CM | POA: Diagnosis not present

## 2017-12-27 DIAGNOSIS — M25512 Pain in left shoulder: Secondary | ICD-10-CM | POA: Diagnosis not present

## 2017-12-27 DIAGNOSIS — M7502 Adhesive capsulitis of left shoulder: Secondary | ICD-10-CM | POA: Diagnosis not present

## 2018-01-10 IMAGING — US US AORTA SCREENING (MEDICARE)
1 series · 14 of 16 positions shown · non-contrast
Comparison: None.

CLINICAL DATA: Medicare screening exam for abdominal aortic
aneurysm.

EXAM:
ABDOMINAL AORTA SCREENING ULTRASOUND
TECHNIQUE: Ultrasound examination of the abdominal aorta was performed as a
screening evaluation for abdominal aortic aneurysm.

[Series 1: us aorta screening (medicare) · 0.32mm/px · 14 of 16 slices shown]
[im 1/16]
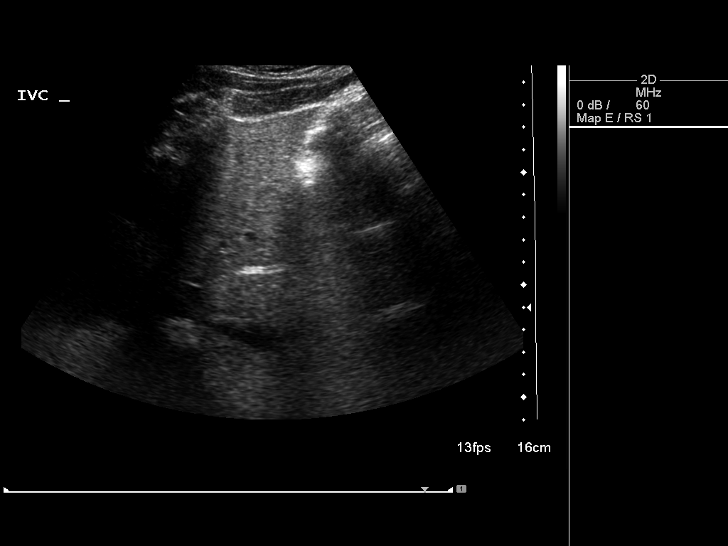
[im 2/16]
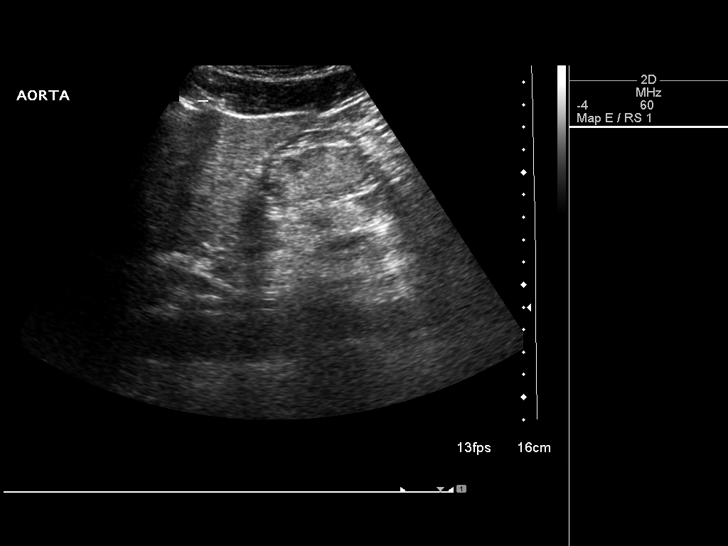
[im 3/16]
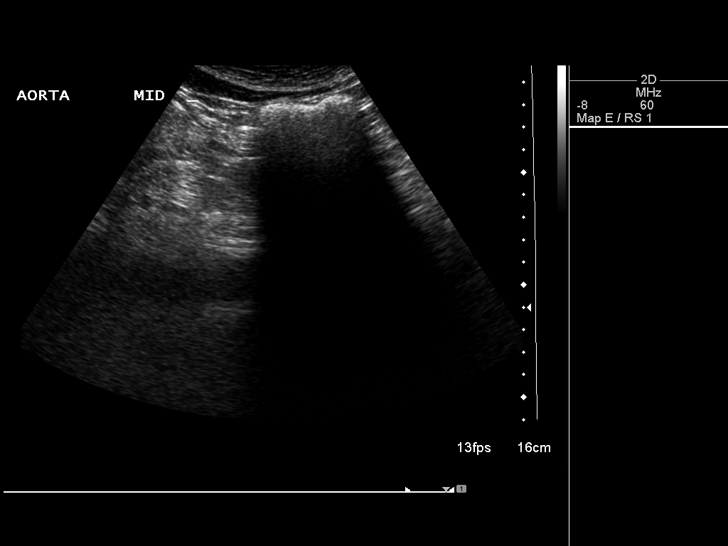
[im 5/16]
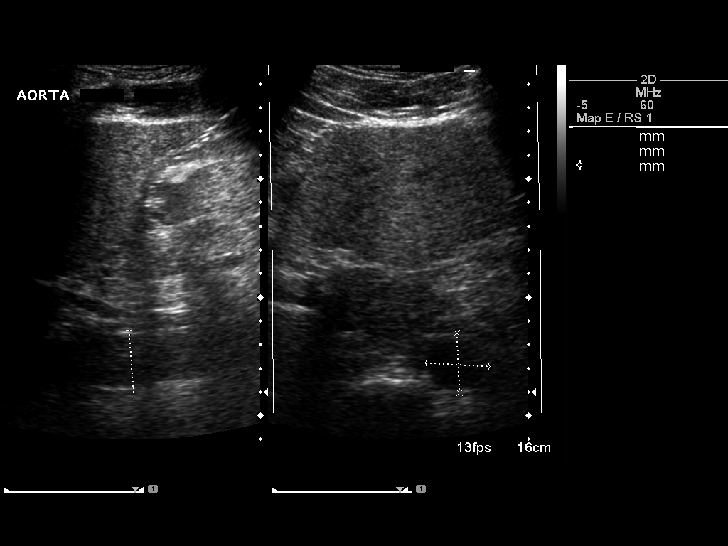
[im 6/16]
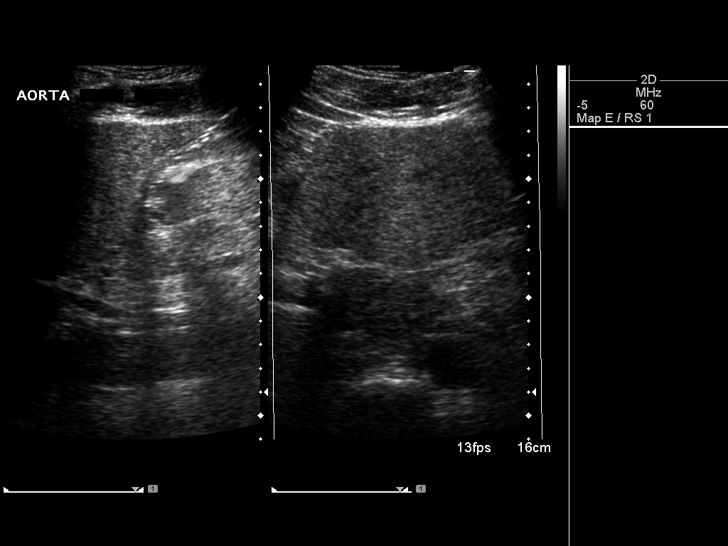
[im 7/16]
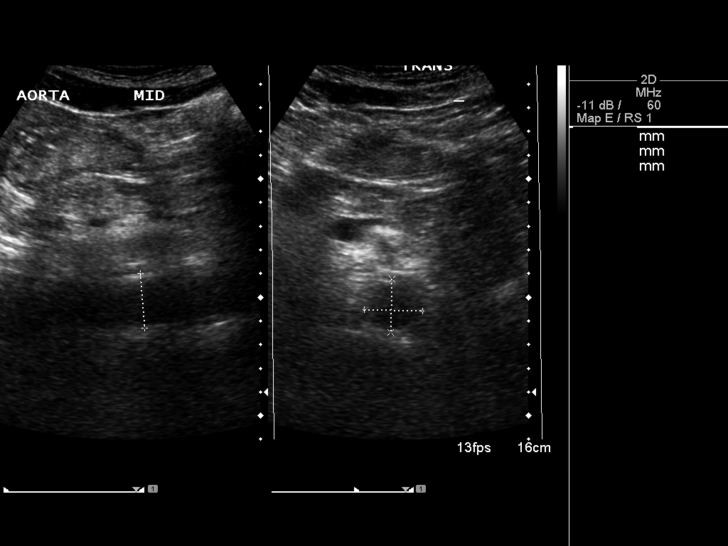
[im 8/16]
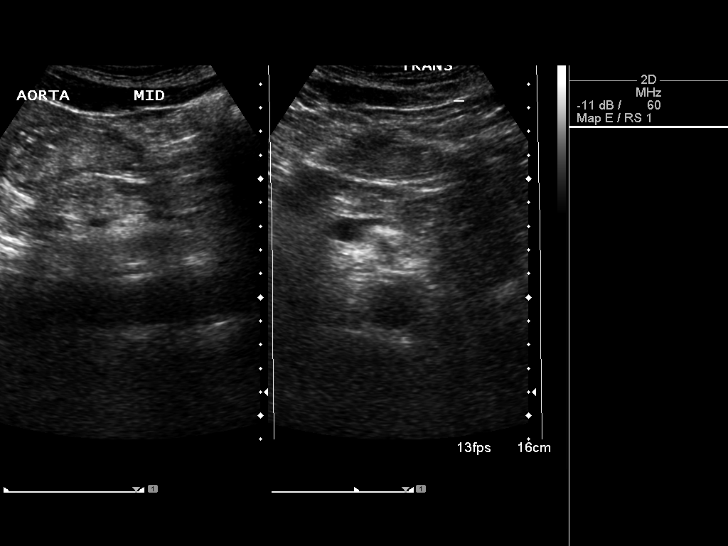
[im 9/16]
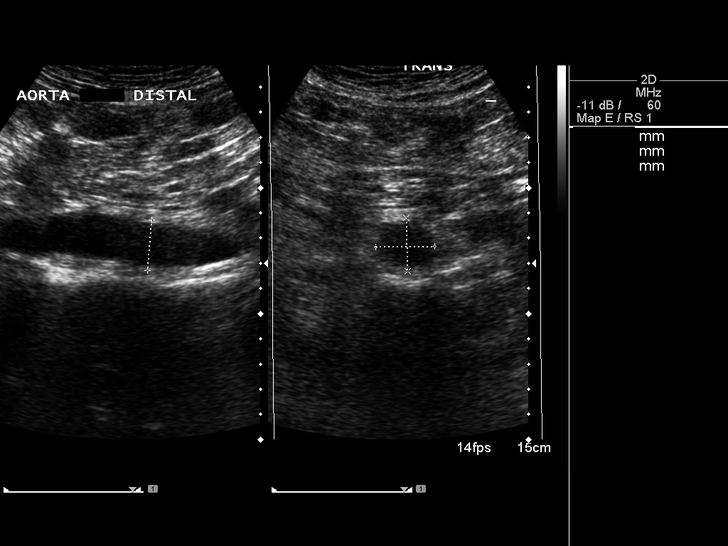
[im 10/16]
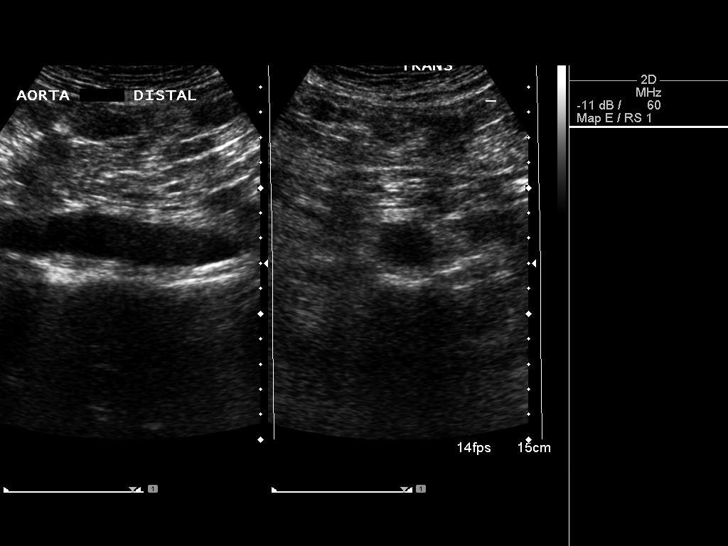
[im 11/16]
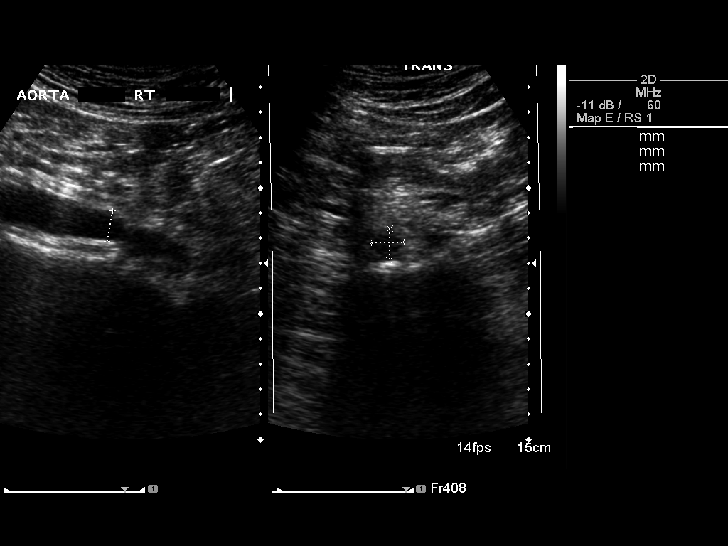
[im 13/16]
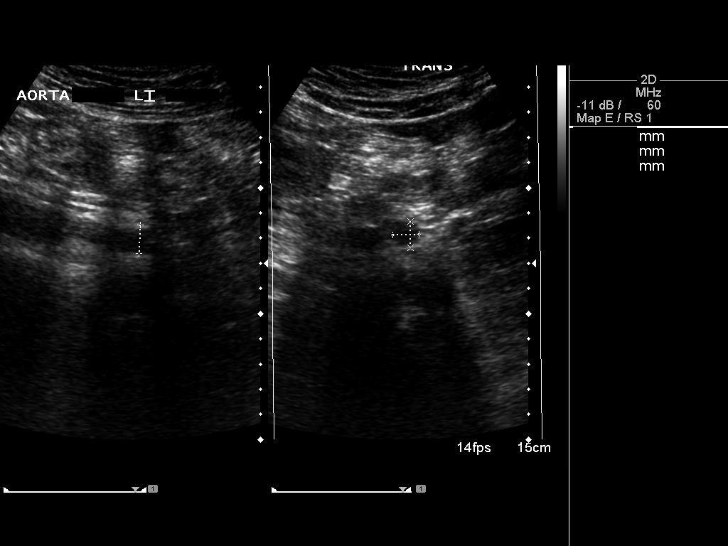
[im 14/16]
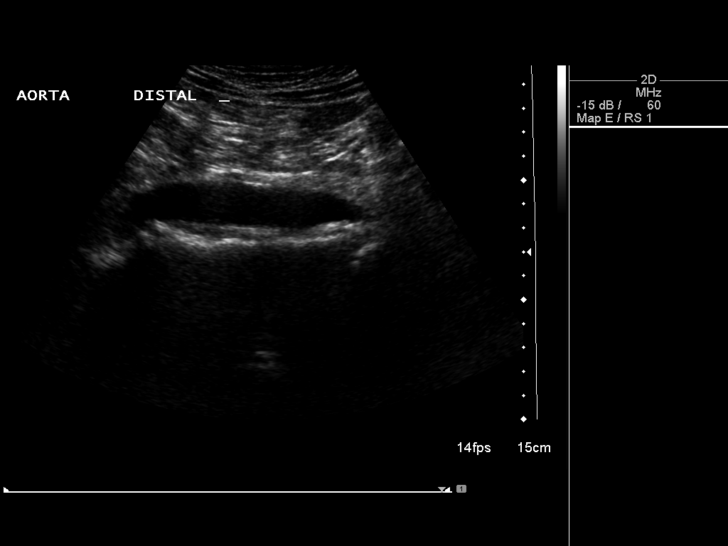
[im 15/16]
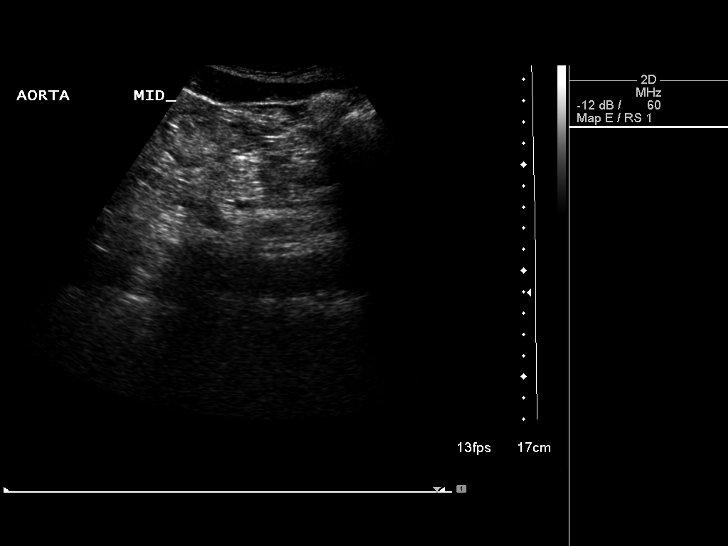
[im 16/16]
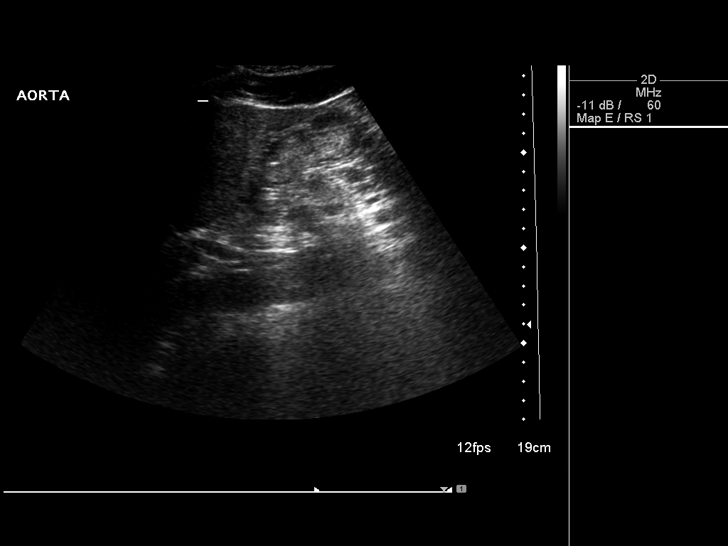

[14 of 16 positions shown; findings below may reference images not displayed]

FINDINGS: Abdominal Aorta

No aneurysm identified.

Maximum Diameter: Proximal aorta measures 2.5 x 2.7 cm in diameter.

Mid aorta measures 2.3 x 2.5 cm in diameter.

Distal aorta measures 2.1 x 2.3 cm in diameter.

Right common iliac artery measures 1.3 x 1.3 cm in diameter.

Left common iliac artery measures 1.1 x 1 cm in diameter.
IMPRESSION: 1. No abdominal aortic aneurysm. Proximal abdominal aorta measures
2.5 x 2.7 cm in diameter.

## 2018-01-14 DIAGNOSIS — M7502 Adhesive capsulitis of left shoulder: Secondary | ICD-10-CM | POA: Diagnosis not present

## 2018-01-14 DIAGNOSIS — M25512 Pain in left shoulder: Secondary | ICD-10-CM | POA: Diagnosis not present

## 2018-01-21 DIAGNOSIS — M7502 Adhesive capsulitis of left shoulder: Secondary | ICD-10-CM | POA: Diagnosis not present

## 2018-01-21 DIAGNOSIS — M25512 Pain in left shoulder: Secondary | ICD-10-CM | POA: Diagnosis not present

## 2018-01-29 DIAGNOSIS — M25512 Pain in left shoulder: Secondary | ICD-10-CM | POA: Diagnosis not present

## 2018-01-29 DIAGNOSIS — M7502 Adhesive capsulitis of left shoulder: Secondary | ICD-10-CM | POA: Diagnosis not present

## 2018-02-18 DIAGNOSIS — M25512 Pain in left shoulder: Secondary | ICD-10-CM | POA: Diagnosis not present

## 2018-02-18 DIAGNOSIS — M7502 Adhesive capsulitis of left shoulder: Secondary | ICD-10-CM | POA: Diagnosis not present

## 2018-02-26 DIAGNOSIS — I10 Essential (primary) hypertension: Secondary | ICD-10-CM | POA: Diagnosis not present

## 2018-02-26 DIAGNOSIS — E1122 Type 2 diabetes mellitus with diabetic chronic kidney disease: Secondary | ICD-10-CM | POA: Diagnosis not present

## 2018-02-26 DIAGNOSIS — Z0000 Encounter for general adult medical examination without abnormal findings: Secondary | ICD-10-CM | POA: Diagnosis not present

## 2018-02-26 DIAGNOSIS — Z23 Encounter for immunization: Secondary | ICD-10-CM | POA: Diagnosis not present

## 2018-02-26 DIAGNOSIS — Z794 Long term (current) use of insulin: Secondary | ICD-10-CM | POA: Diagnosis not present

## 2018-02-26 DIAGNOSIS — Z125 Encounter for screening for malignant neoplasm of prostate: Secondary | ICD-10-CM | POA: Diagnosis not present

## 2018-02-26 DIAGNOSIS — F419 Anxiety disorder, unspecified: Secondary | ICD-10-CM | POA: Diagnosis not present

## 2018-02-26 DIAGNOSIS — E7800 Pure hypercholesterolemia, unspecified: Secondary | ICD-10-CM | POA: Diagnosis not present

## 2018-03-13 DIAGNOSIS — C61 Malignant neoplasm of prostate: Secondary | ICD-10-CM | POA: Diagnosis not present

## 2018-04-17 DIAGNOSIS — R1030 Lower abdominal pain, unspecified: Secondary | ICD-10-CM | POA: Diagnosis not present

## 2018-04-17 DIAGNOSIS — R109 Unspecified abdominal pain: Secondary | ICD-10-CM | POA: Diagnosis not present

## 2018-04-18 ENCOUNTER — Other Ambulatory Visit: Payer: Self-pay | Admitting: Family Medicine

## 2018-04-18 DIAGNOSIS — R109 Unspecified abdominal pain: Secondary | ICD-10-CM

## 2018-04-19 ENCOUNTER — Ambulatory Visit
Admission: RE | Admit: 2018-04-19 | Discharge: 2018-04-19 | Disposition: A | Discharge status: Home / Self Care | Payer: PPO | Source: Ambulatory Visit | Attending: Family Medicine | Admitting: Family Medicine

## 2018-04-19 DIAGNOSIS — C61 Malignant neoplasm of prostate: Secondary | ICD-10-CM | POA: Diagnosis not present

## 2018-04-19 DIAGNOSIS — N202 Calculus of kidney with calculus of ureter: Secondary | ICD-10-CM | POA: Diagnosis not present

## 2018-04-19 DIAGNOSIS — N132 Hydronephrosis with renal and ureteral calculous obstruction: Secondary | ICD-10-CM | POA: Diagnosis not present

## 2018-04-19 DIAGNOSIS — N393 Stress incontinence (female) (male): Secondary | ICD-10-CM | POA: Diagnosis not present

## 2018-04-19 DIAGNOSIS — R109 Unspecified abdominal pain: Secondary | ICD-10-CM

## 2018-04-19 DIAGNOSIS — N5231 Erectile dysfunction following radical prostatectomy: Secondary | ICD-10-CM | POA: Diagnosis not present

## 2018-04-19 MED ORDER — IOPAMIDOL (ISOVUE-300) INJECTION 61%
100.0000 mL | Freq: Once | INTRAVENOUS | Status: AC | PRN
  Administered 2018-04-19: 100 mL via INTRAVENOUS

## 2018-04-20 ENCOUNTER — Emergency Department (HOSPITAL_BASED_OUTPATIENT_CLINIC_OR_DEPARTMENT_OTHER): Payer: PPO

## 2018-04-20 ENCOUNTER — Other Ambulatory Visit: Payer: Self-pay

## 2018-04-20 ENCOUNTER — Inpatient Hospital Stay (HOSPITAL_BASED_OUTPATIENT_CLINIC_OR_DEPARTMENT_OTHER)
Admission: EM | Admit: 2018-04-20 | Discharge: 2018-04-22 | DRG: 660 | Disposition: A | Discharge status: Home / Self Care | Payer: PPO | Attending: Internal Medicine | Admitting: Internal Medicine

## 2018-04-20 ENCOUNTER — Encounter (HOSPITAL_BASED_OUTPATIENT_CLINIC_OR_DEPARTMENT_OTHER): Payer: Self-pay | Admitting: Emergency Medicine

## 2018-04-20 DIAGNOSIS — Z881 Allergy status to other antibiotic agents status: Secondary | ICD-10-CM

## 2018-04-20 DIAGNOSIS — Z466 Encounter for fitting and adjustment of urinary device: Secondary | ICD-10-CM | POA: Diagnosis not present

## 2018-04-20 DIAGNOSIS — Z9079 Acquired absence of other genital organ(s): Secondary | ICD-10-CM | POA: Diagnosis not present

## 2018-04-20 DIAGNOSIS — R112 Nausea with vomiting, unspecified: Secondary | ICD-10-CM

## 2018-04-20 DIAGNOSIS — N179 Acute kidney failure, unspecified: Secondary | ICD-10-CM | POA: Diagnosis not present

## 2018-04-20 DIAGNOSIS — K5730 Diverticulosis of large intestine without perforation or abscess without bleeding: Secondary | ICD-10-CM | POA: Diagnosis present

## 2018-04-20 DIAGNOSIS — N211 Calculus in urethra: Secondary | ICD-10-CM | POA: Diagnosis not present

## 2018-04-20 DIAGNOSIS — N201 Calculus of ureter: Secondary | ICD-10-CM

## 2018-04-20 DIAGNOSIS — D72823 Leukemoid reaction: Secondary | ICD-10-CM | POA: Diagnosis not present

## 2018-04-20 DIAGNOSIS — I10 Essential (primary) hypertension: Secondary | ICD-10-CM

## 2018-04-20 DIAGNOSIS — Z7952 Long term (current) use of systemic steroids: Secondary | ICD-10-CM | POA: Diagnosis not present

## 2018-04-20 DIAGNOSIS — N200 Calculus of kidney: Secondary | ICD-10-CM | POA: Diagnosis not present

## 2018-04-20 DIAGNOSIS — Z794 Long term (current) use of insulin: Secondary | ICD-10-CM | POA: Diagnosis not present

## 2018-04-20 DIAGNOSIS — K5900 Constipation, unspecified: Secondary | ICD-10-CM | POA: Diagnosis present

## 2018-04-20 DIAGNOSIS — Z79899 Other long term (current) drug therapy: Secondary | ICD-10-CM | POA: Diagnosis not present

## 2018-04-20 DIAGNOSIS — N132 Hydronephrosis with renal and ureteral calculous obstruction: Secondary | ICD-10-CM | POA: Diagnosis not present

## 2018-04-20 DIAGNOSIS — Z79891 Long term (current) use of opiate analgesic: Secondary | ICD-10-CM

## 2018-04-20 DIAGNOSIS — E119 Type 2 diabetes mellitus without complications: Secondary | ICD-10-CM

## 2018-04-20 DIAGNOSIS — K567 Ileus, unspecified: Secondary | ICD-10-CM | POA: Diagnosis not present

## 2018-04-20 DIAGNOSIS — Z888 Allergy status to other drugs, medicaments and biological substances status: Secondary | ICD-10-CM | POA: Diagnosis not present

## 2018-04-20 DIAGNOSIS — E785 Hyperlipidemia, unspecified: Secondary | ICD-10-CM | POA: Diagnosis not present

## 2018-04-20 DIAGNOSIS — Z885 Allergy status to narcotic agent status: Secondary | ICD-10-CM | POA: Diagnosis not present

## 2018-04-20 DIAGNOSIS — E871 Hypo-osmolality and hyponatremia: Secondary | ICD-10-CM

## 2018-04-20 DIAGNOSIS — N393 Stress incontinence (female) (male): Secondary | ICD-10-CM | POA: Diagnosis not present

## 2018-04-20 DIAGNOSIS — D72829 Elevated white blood cell count, unspecified: Secondary | ICD-10-CM

## 2018-04-20 DIAGNOSIS — Z8546 Personal history of malignant neoplasm of prostate: Secondary | ICD-10-CM | POA: Diagnosis not present

## 2018-04-20 DIAGNOSIS — N1330 Unspecified hydronephrosis: Secondary | ICD-10-CM | POA: Diagnosis not present

## 2018-04-20 LAB — COMPREHENSIVE METABOLIC PANEL
ALK PHOS: 56 U/L (ref 38–126)
ALT: 16 U/L (ref 0–44)
ANION GAP: 12 (ref 5–15)
AST: 20 U/L (ref 15–41)
Albumin: 4.1 g/dL (ref 3.5–5.0)
BUN: 18 mg/dL (ref 8–23)
CO2: 20 mmol/L — ABNORMAL LOW (ref 22–32)
Calcium: 9 mg/dL (ref 8.9–10.3)
Chloride: 100 mmol/L (ref 98–111)
Creatinine, Ser: 2.06 mg/dL — ABNORMAL HIGH (ref 0.61–1.24)
GFR calc Af Amer: 37 mL/min — ABNORMAL LOW (ref >60)
GFR calc non Af Amer: 32 mL/min — ABNORMAL LOW (ref >60)
GLUCOSE: 168 mg/dL — AB (ref 70–99)
Potassium: 3.8 mmol/L (ref 3.5–5.1)
Sodium: 132 mmol/L — ABNORMAL LOW (ref 135–145)
Total Bilirubin: 1.3 mg/dL — ABNORMAL HIGH (ref 0.3–1.2)
Total Protein: 7.1 g/dL (ref 6.5–8.1)

## 2018-04-20 LAB — LIPASE, BLOOD: Lipase: 30 U/L (ref 11–51)

## 2018-04-20 LAB — CBC WITH DIFFERENTIAL/PLATELET
Abs Immature Granulocytes: 0.05 10*3/uL (ref 0.00–0.07)
Basophils Absolute: 0 10*3/uL (ref 0.0–0.1)
Basophils Relative: 0 %
Eosinophils Absolute: 0 10*3/uL (ref 0.0–0.5)
Eosinophils Relative: 0 %
HCT: 42 % (ref 39.0–52.0)
Hemoglobin: 13.6 g/dL (ref 13.0–17.0)
Immature Granulocytes: 0 %
LYMPHS PCT: 8 %
Lymphs Abs: 1 10*3/uL (ref 0.7–4.0)
MCH: 28.5 pg (ref 26.0–34.0)
MCHC: 32.4 g/dL (ref 30.0–36.0)
MCV: 88.1 fL (ref 80.0–100.0)
Monocytes Absolute: 1.1 10*3/uL — ABNORMAL HIGH (ref 0.1–1.0)
Monocytes Relative: 9 %
Neutro Abs: 10.2 10*3/uL — ABNORMAL HIGH (ref 1.7–7.7)
Neutrophils Relative %: 83 %
Platelets: 161 10*3/uL (ref 150–400)
RBC: 4.77 MIL/uL (ref 4.22–5.81)
RDW: 12.7 % (ref 11.5–15.5)
WBC: 12.4 10*3/uL — ABNORMAL HIGH (ref 4.0–10.5)
nRBC: 0 % (ref 0.0–0.2)

## 2018-04-20 LAB — URINALYSIS, ROUTINE W REFLEX MICROSCOPIC
Bilirubin Urine: NEGATIVE
GLUCOSE, UA: NEGATIVE mg/dL
Ketones, ur: 15 mg/dL — AB
Leukocytes,Ua: NEGATIVE
Nitrite: NEGATIVE
Protein, ur: 30 mg/dL — AB
SPECIFIC GRAVITY, URINE: 1.02 (ref 1.005–1.030)
pH: 6 (ref 5.0–8.0)

## 2018-04-20 LAB — GLUCOSE, CAPILLARY
Glucose-Capillary: 104 mg/dL — ABNORMAL HIGH (ref 70–99)
Glucose-Capillary: 115 mg/dL — ABNORMAL HIGH (ref 70–99)
Glucose-Capillary: 119 mg/dL — ABNORMAL HIGH (ref 70–99)

## 2018-04-20 LAB — URINALYSIS, MICROSCOPIC (REFLEX): Bacteria, UA: NONE SEEN

## 2018-04-20 LAB — OCCULT BLOOD X 1 CARD TO LAB, STOOL: Fecal Occult Bld: NEGATIVE

## 2018-04-20 LAB — CBG MONITORING, ED: Glucose-Capillary: 122 mg/dL — ABNORMAL HIGH (ref 70–99)

## 2018-04-20 MED ORDER — TAMSULOSIN HCL 0.4 MG PO CAPS
0.4000 mg | ORAL_CAPSULE | Freq: Every day | ORAL | Status: DC
  Administered 2018-04-20 – 2018-04-22 (×3): 0.4 mg via ORAL
  Filled 2018-04-20 (×3): qty 1

## 2018-04-20 MED ORDER — SODIUM CHLORIDE 0.9 % IV SOLN
INTRAVENOUS | Status: AC
  Administered 2018-04-20 – 2018-04-21 (×2): via INTRAVENOUS

## 2018-04-20 MED ORDER — ONDANSETRON HCL 4 MG/2ML IJ SOLN
4.0000 mg | Freq: Once | INTRAMUSCULAR | Status: AC
  Administered 2018-04-20: 4 mg via INTRAVENOUS
  Filled 2018-04-20: qty 2

## 2018-04-20 MED ORDER — ACETAMINOPHEN 650 MG RE SUPP: 650.0000 mg | Freq: Four times a day (QID) | RECTAL | Status: DC | PRN

## 2018-04-20 MED ORDER — SODIUM CHLORIDE 0.9 % IV BOLUS
1000.0000 mL | Freq: Once | INTRAVENOUS | Status: AC
  Administered 2018-04-20: 1000 mL via INTRAVENOUS

## 2018-04-20 MED ORDER — CITALOPRAM HYDROBROMIDE 20 MG PO TABS
20.0000 mg | ORAL_TABLET | Freq: Every day | ORAL | Status: DC
  Administered 2018-04-20 – 2018-04-22 (×3): 20 mg via ORAL
  Filled 2018-04-20 (×3): qty 1

## 2018-04-20 MED ORDER — ACETAMINOPHEN 325 MG PO TABS: 650.0000 mg | ORAL_TABLET | Freq: Four times a day (QID) | ORAL | Status: DC | PRN

## 2018-04-20 MED ORDER — HYDROMORPHONE HCL 1 MG/ML IJ SOLN
1.0000 mg | Freq: Once | INTRAMUSCULAR | Status: AC
  Administered 2018-04-20: 1 mg via INTRAVENOUS
  Filled 2018-04-20: qty 1

## 2018-04-20 MED ORDER — ONDANSETRON HCL 4 MG PO TABS: 4.0000 mg | ORAL_TABLET | Freq: Four times a day (QID) | ORAL | Status: DC | PRN

## 2018-04-20 MED ORDER — HEPARIN SODIUM (PORCINE) 5000 UNIT/ML IJ SOLN
5000.0000 [IU] | Freq: Three times a day (TID) | INTRAMUSCULAR | Status: DC
  Administered 2018-04-20: 5000 [IU] via SUBCUTANEOUS
  Filled 2018-04-20: qty 1

## 2018-04-20 MED ORDER — ONDANSETRON HCL 4 MG/2ML IJ SOLN: 4.0000 mg | Freq: Four times a day (QID) | INTRAMUSCULAR | Status: DC | PRN

## 2018-04-20 MED ORDER — HYDROCODONE-ACETAMINOPHEN 5-325 MG PO TABS
2.0000 | ORAL_TABLET | Freq: Four times a day (QID) | ORAL | Status: DC | PRN
  Administered 2018-04-20 (×2): 2 via ORAL
  Filled 2018-04-20 (×2): qty 2

## 2018-04-20 NOTE — ED Provider Notes (Signed)
[facility] EMERGENCY DEPARTMENT Provider Note   CSN: [id number] Arrival date & time: 04/20/18  6644    History   Chief Complaint Chief Complaint  Patient presents with  . Abdominal Pain    HPI Nicholas Maldonado is a 68 y.o. male.     Patient reports 3 days of lower abdominal pain that is fairly constant.  States he had an episode of diarrhea 2 days ago and since then has been constipated not able to pass gas or stool.  Describes constant lower abdominal pain that he is not had in the past.  He had multiple episodes of nausea and vomiting yesterday.  No fever.  Some pain and burning with urination.  He went to his PCP who ordered a CT scan which showed 2 kidney stones on the left and some stool. He then saw the urologist in the office. Patient took 1 dose of MiraLAX at home.  He also has been using hydrocodone for kidney stones but does not take this on a regular basis.  States he does not normally have problems with constipation.  Denies any back pain or chest pain.  Denies any pain with radiation down his legs.  Comes in tonight because his abdominal pain is getting worse and he feels like he is very uncomfortable unable to have a bowel movement.  The history is provided by the patient and the spouse.  Abdominal Pain  Associated symptoms: constipation, diarrhea, dysuria, nausea and vomiting   Associated symptoms: no chest pain, no cough, no fatigue, no hematuria and no shortness of breath     Past Medical History:  Diagnosis Date  . Diabetes mellitus   . Has artificial bladder   . Hypercholesterolemia   . Hypertension     There are no active problems to display for this patient.   Past Surgical History:  Procedure Laterality Date  . aus  Artificial urinary sphincter  . BRAIN SURGERY    . KIDNEY SURGERY    . PROSTATE SURGERY          Home Medications    Prior to Admission medications   Medication Sig Start Date End Date Taking? Authorizing Provider   tamsulosin (FLOMAX) 0.4 MG CAPS capsule Take 0.4 mg by mouth.   Yes [provider]  HYDROcodone-acetaminophen (NORCO/VICODIN) 5-325 MG tablet Take 2 tablets by mouth every 4 (four) hours as needed. 06/12/15   Tanna Furry, MD  insulin glargine (LANTUS SOLOSTAR) 100 UNIT/ML injection Inject 60 Units into the skin daily.    [provider]  metFORMIN (GLUCOPHAGE) 500 MG tablet Take 500 mg by mouth 2 (two) times daily with a meal.    [provider]  methocarbamol (ROBAXIN) 500 MG tablet Take 1 tablet (500 mg total) by mouth 3 (three) times daily between meals as needed. 06/12/15   Tanna Furry, MD  Multiple Vitamin (MULITIVITAMIN WITH MINERALS) TABS Take 1 tablet by mouth daily.    [provider]  predniSONE (DELTASONE) 20 MG tablet Take 1 tablet (20 mg total) by mouth 2 (two) times daily with a meal. 06/12/15   Tanna Furry, MD  ranitidine (ZANTAC) 150 MG tablet Take 150 mg by mouth daily.    [provider]  sildenafil (REVATIO) 20 MG tablet Take 20 mg by mouth 3 (three) times daily.    [provider]  simvastatin (ZOCOR) 20 MG tablet Take 20 mg by mouth daily.    [provider]    Family History No family  history on file.  Social History Social History   Tobacco Use  . Smoking status: Never Smoker  Substance Use Topics  . Alcohol use: Yes  . Drug use: No     Allergies   Levaquin [levofloxacin in d5w]; Other; Percocet [oxycodone-acetaminophen]; and Terbinafine hcl   Review of Systems Review of Systems  Constitutional: Positive for appetite change. Negative for fatigue.  HENT: Negative for congestion and rhinorrhea.   Respiratory: Negative for cough, chest tightness and shortness of breath.   Cardiovascular: Negative for chest pain. Leg swelling: .ro.  Gastrointestinal: Positive for abdominal pain, constipation, diarrhea, nausea and vomiting.  Genitourinary: Positive for dysuria. Negative for decreased urine volume,  hematuria, testicular pain and urgency. Scrotal swelling: .ro.  Musculoskeletal: Negative for back pain.  Skin: Negative for rash.  Neurological: Negative for dizziness, weakness and headaches.     all other systems are negative except as noted in the HPI and PMH.    Physical Exam Updated Vital Signs BP (!) 157/74 (BP Location: Right Arm)   Temp 97.8 F (36.6 C) (Oral)   Resp (!) 84   Ht 6' (1.829 m)   Wt 82.6 kg   SpO2 100%   BMI 24.68 kg/m   Physical Exam Vitals signs and nursing note reviewed.  Constitutional:      General: He is in acute distress.     Appearance: He is well-developed.     Comments: uncomfortable  HENT:     Head: Normocephalic and atraumatic.     Mouth/Throat:     Pharynx: No oropharyngeal exudate.  Eyes:     Conjunctiva/sclera: Conjunctivae normal.     Pupils: Pupils are equal, round, and reactive to light.  Neck:     Musculoskeletal: Normal range of motion and neck supple.     Comments: No meningismus. Cardiovascular:     Rate and Rhythm: Normal rate and regular rhythm.     Heart sounds: Normal heart sounds. No murmur.  Pulmonary:     Effort: Pulmonary effort is normal. No respiratory distress.     Breath sounds: Normal breath sounds.  Abdominal:     General: There is distension.     Palpations: Abdomen is soft.     Tenderness: There is abdominal tenderness. There is guarding. There is no rebound.     Comments: Diffuse lower abdominal tenderness with voluntary guarding.  No masses.  Equal femoral pulses.  Genitourinary:    Comments: No testicular pain No fecal impaction Musculoskeletal: Normal range of motion.        General: No tenderness.  Skin:    General: Skin is warm.     Capillary Refill: Capillary refill takes less than 2 seconds.  Neurological:     General: No focal deficit present.     Mental Status: He is alert and oriented to person, place, and time. Mental status is at baseline.     Cranial Nerves: No cranial nerve deficit.      Motor: No abnormal muscle tone.     Coordination: Coordination normal.     Comments:  5/5 strength throughout. CN 2-12 intact.Equal grip strength.   Psychiatric:        Behavior: Behavior normal.      ED Treatments / Results  Labs (all labs ordered are listed, but only abnormal results are displayed) Labs Reviewed  URINALYSIS, ROUTINE W REFLEX MICROSCOPIC - Abnormal; Notable for the following components:      Result Value   Hgb urine dipstick SMALL (*)  Ketones, ur 15 (*)    Protein, ur 30 (*)    All other components within normal limits  CBC WITH DIFFERENTIAL/PLATELET - Abnormal; Notable for the following components:   WBC 12.4 (*)    Neutro Abs 10.2 (*)    Monocytes Absolute 1.1 (*)    All other components within normal limits  COMPREHENSIVE METABOLIC PANEL - Abnormal; Notable for the following components:   Sodium 132 (*)    CO2 20 (*)    Glucose, Bld 168 (*)    Creatinine, Ser 2.06 (*)    Total Bilirubin 1.3 (*)    GFR calc non Af Amer 32 (*)    GFR calc Af Amer 37 (*)    All other components within normal limits  LIPASE, BLOOD  OCCULT BLOOD X 1 CARD TO LAB, STOOL  URINALYSIS, MICROSCOPIC (REFLEX)    EKG None  Radiology Ct Abdomen Pelvis W Contrast  Result Date: 04/19/2018 CLINICAL DATA:  Lower abdominal pain for the past 3-4 weeks, worse for the past 3 days. Ex-smoker. Previous prostatectomy for prostate cancer. Previous urethral sphincter surgery. EXAM: CT ABDOMEN AND PELVIS WITH CONTRAST TECHNIQUE: Multidetector CT imaging of the abdomen and pelvis was performed using the standard protocol following bolus administration of intravenous contrast. CONTRAST:  168mL ISOVUE-300 IOPAMIDOL (ISOVUE-300) INJECTION 61% COMPARISON:  07/08/2011. FINDINGS: Lower chest: Mild bibasilar linear atelectasis or scarring. Normal sized heart. Hepatobiliary: No focal liver abnormality is seen. No gallstones, gallbladder wall thickening, or biliary dilatation. Pancreas: Unremarkable.  No pancreatic ductal dilatation or surrounding inflammatory changes. Spleen: Interval visualization of a 6 mm rounded area of low density in the superior aspect of the spleen. The spleen is normal in size and shape. Adrenals/Urinary Tract: Normal appearing adrenal glands. Bilateral renal calculi. The largest is in the lower pole of the left kidney, measuring 7 mm. Small bilateral renal cysts. Moderate dilatation of the left renal collecting system and ureter the level of the distal ureter at the ureterovesical junction. There are 2 calculi in ureter at that location, 1 measuring 5 mm in the other measures 4 mm. No dilatation of the right ureter or renal collecting system. No significant urine in the bladder with no bladder calculi visualized. There is a urethral cuff with associated tubing and reservoir. Stomach/Bowel: The right colon is distended with stool in the transverse colon is distended with gas. There are multiple proximal descending colon diverticula without evidence of diverticulitis. No obstructing mass is seen. Normal appearing appendix, small bowel and stomach. Mild diffuse low density distal esophageal wall thickening. Vascular/Lymphatic: Mild atheromatous arterial calcifications without aneurysm. Mildly enlarged left para-aortic lymph node at the level of the left kidney, with a short axis diameter of 10 mm on image number 38 series 2, without significant change. Otherwise, no enlarged lymph nodes are seen. Reproductive: Surgically absent prostate gland with surgical clips. Other: Small left inguinal hernia containing fat and small proximal right inguinal hernia containing fat. Musculoskeletal: Mild lumbar and lower thoracic spine degenerative changes. Proximal right femoral bone islands. Small cysts in both proximal femurs and right acetabulum. No evidence of bony metastatic disease. IMPRESSION: 1. Two distal left ureteral calculi at the ureterovesical junction, causing moderate left hydronephrosis  and hydroureter. 2. Multiple bilateral, nonobstructing renal calculi. 3. Single mildly enlarged left para-aortic lymph node. This is unchanged since 07/08/2011, compatible with a mildly reactive node rather than a metastatic node. 4. Mild diffuse low density distal esophageal wall thickening. This could be due to reflux esophagitis. 5. Colonic diverticulosis  and ileus. Electronically Signed   By: Claudie Revering M.D.   On: 04/19/2018 09:47    Procedures Procedures (including critical care time)  Medications Ordered in ED Medications  HYDROmorphone (DILAUDID) injection 1 mg (has no administration in time range)  ondansetron (ZOFRAN) injection 4 mg (has no administration in time range)  sodium chloride 0.9 % bolus 1,000 mL (has no administration in time range)     Initial Impression / Assessment and Plan / ED Course  I have reviewed the triage vital signs and the nursing notes.  Pertinent labs & imaging results that were available during my care of the patient were reviewed by me and considered in my medical decision making (see chart for details).       3 days of lower abdominal pain with nausea and vomiting and constipation.  Outpatient CT scan yesterday showed 2 left UVJ stones with left-sided hydronephrosis.  As well as colonic diverticulosis and ileus.  Bladder scan is 0. Will obtain labs and urinalysis and acute abdominal series x-ray.  Acute abdominal series shows multiple loops of colon consistent with ileus.  No bowel obstruction.  Labs show a creatinine of 2.0.  This is improved from 2013 though patient seems to have normal kidney function at baseline and it did return to baseline in 2013. He did receive outpatient contrast earlier today.  Urinalysis negative for infection.  Patient's pain is much improved on recheck.  Abdomen is soft.  Discussed admission for hydration in setting of AKI with hydronephrosis.  Also with ileus likely contributing to his abdominal pain and  distention.  Discussed with urology Dr. Tresa Moore who saw patient in the office yesterday.  He does not feel patient's UVJ stones with hydronephrosis are contributing to his creatinine elevation.  Urinalysis is negative.  Agrees with medical admission for hydration and monitoring.  Admission discussed with Dr. Marlowe Sax accepts patient to [facility].  Will hold NG tube at this time as patient is not having any vomiting. Final Clinical Impressions(s) / ED Diagnoses   Final diagnoses:  AKI (acute kidney injury) [facility])  Ureteral stone  Ileus [facility])    ED Discharge Orders    None       Ezequiel Essex, MD 04/20/18 [phone]

## 2018-04-20 NOTE — Consult Note (Signed)
Reason for Consult: Left ureteral / renal stones, prostate cacner, acute renal failure  Referring Physician: Charlann Lange MD  Nicholas Maldonado is an 68 y.o. male.   HPI:  1 - Urolithiasis -  04/2018 - Left 4.29mm distal / UVJ stone x 2 with mild hydro, LLP 28mm non-obstruting by CT. UA w/o infectious parameters.   2 - Prostate Cancer - s/p open prostatectomy 2008 then adjuvant radiation. Gets intermitant androgen deprivation through medical oncology at [facility]. CT 04/2018 with seminal vesicals still in place.   3 - Stress Urinary Incontinence - s/p artifidial urinary sphincter previousyly, happy with result.   4 - Acute Renal Failure - Cr 2.06 up from baslien < 1.2 by ER labs 3/14. Admits to some nausea and poor PO intake. He did get contrast CT 04/19/18.  PMH sig for IDDM2, NO ischemic CV disease / blood thinners. His PCP is London Pepper MD.   Today " Nicholas Maldonado " is seen in consultation for above.       Past Medical History:  Diagnosis Date  . Diabetes mellitus   . Has artificial bladder   . Hypercholesterolemia   . Hypertension     Past Surgical History:  Procedure Laterality Date  . aus  Artificial urinary sphincter  . BRAIN SURGERY    . KIDNEY SURGERY    . PROSTATE SURGERY      History reviewed. No pertinent family history.  Social History:  reports that he has never smoked. He has never used smokeless tobacco. He reports current alcohol use. He reports that he does not use drugs.  Allergies:  Allergies  Allergen Reactions  . Levaquin [Levofloxacin In D5w] Nausea And Vomiting  . Percocet [Oxycodone-Acetaminophen] Nausea And Vomiting  . Terbinafine Hcl Other (See Comments)    High liver count    Medications: I have reviewed the patient's current medications.  Results for orders placed or performed during the hospital encounter of 04/20/18 (from the past 48 hour(s))  CBC with Differential/Platelet     Status: Abnormal   Collection Time: 04/20/18  4:19 AM  Result Value Ref Range    WBC 12.4 (H) 4.0 - 10.5 K/uL   RBC 4.77 4.22 - 5.81 MIL/uL   Hemoglobin 13.6 13.0 - 17.0 g/dL   HCT 42.0 39.0 - 52.0 %   MCV 88.1 80.0 - 100.0 fL   MCH 28.5 26.0 - 34.0 pg   MCHC 32.4 30.0 - 36.0 g/dL   RDW 12.7 11.5 - 15.5 %   Platelets 161 150 - 400 K/uL   nRBC 0.0 0.0 - 0.2 %   Neutrophils Relative % 83 %   Neutro Abs 10.2 (H) 1.7 - 7.7 K/uL   Lymphocytes Relative 8 %   Lymphs Abs 1.0 0.7 - 4.0 K/uL   Monocytes Relative 9 %   Monocytes Absolute 1.1 (H) 0.1 - 1.0 K/uL   Eosinophils Relative 0 %   Eosinophils Absolute 0.0 0.0 - 0.5 K/uL   Basophils Relative 0 %   Basophils Absolute 0.0 0.0 - 0.1 K/uL   Immature Granulocytes 0 %   Abs Immature Granulocytes 0.05 0.00 - 0.07 K/uL    Comment: Performed at [facility], [address] [place], [place] [address]  Comprehensive metabolic panel     Status: Abnormal   Collection Time: 04/20/18  4:19 AM  Result Value Ref Range   Sodium 132 (L) 135 - 145 mmol/L   Potassium 3.8 3.5 - 5.1 mmol/L   Chloride 100 98 -  111 mmol/L   CO2 20 (L) 22 - 32 mmol/L   Glucose, Bld 168 (H) 70 - 99 mg/dL   BUN 18 8 - 23 mg/dL   Creatinine, Ser 2.06 (H) 0.61 - 1.24 mg/dL   Calcium 9.0 8.9 - 10.3 mg/dL   Total Protein 7.1 6.5 - 8.1 g/dL   Albumin 4.1 3.5 - 5.0 g/dL   AST 20 15 - 41 U/L   ALT 16 0 - 44 U/L   Alkaline Phosphatase 56 38 - 126 U/L   Total Bilirubin 1.3 (H) 0.3 - 1.2 mg/dL   GFR calc non Af Amer 32 (L) >60 mL/min   GFR calc Af Amer 37 (L) >60 mL/min   Anion gap 12 5 - 15    Comment: Performed at [facility], [address] [place], [place] [address]  Lipase, blood     Status: None   Collection Time: 04/20/18  4:19 AM  Result Value Ref Range   Lipase 30 11 - 51 U/L    Comment: Performed at [facility], [address] [place], [place] [address]  Occult blood card to lab, stool     Status: None   Collection Time: 04/20/18  4:19 AM  Result Value Ref Range   Fecal Occult Bld NEGATIVE NEGATIVE     Comment: Performed at [facility], [address] [place], [place] [address]  Urinalysis, Routine w reflex microscopic     Status: Abnormal   Collection Time: 04/20/18  5:19 AM  Result Value Ref Range   Color, Urine YELLOW YELLOW   APPearance CLEAR CLEAR   Specific Gravity, Urine 1.020 1.005 - 1.030   pH 6.0 5.0 - 8.0   Glucose, UA NEGATIVE NEGATIVE mg/dL   Hgb urine dipstick SMALL (A) NEGATIVE   Bilirubin Urine NEGATIVE NEGATIVE   Ketones, ur 15 (A) NEGATIVE mg/dL   Protein, ur 30 (A) NEGATIVE mg/dL   Nitrite NEGATIVE NEGATIVE   Leukocytes,Ua NEGATIVE NEGATIVE    Comment: Performed at [facility], [address] [place], [place] [address]  Urinalysis, Microscopic (reflex)     Status: None   Collection Time: 04/20/18  5:19 AM  Result Value Ref Range   RBC / HPF 0-5 0 - 5 RBC/hpf   WBC, UA 0-5 0 - 5 WBC/hpf   Bacteria, UA NONE SEEN NONE SEEN   Squamous Epithelial / LPF 0-5 0 - 5    Comment: Performed at [facility], [address] [place], [place] [address]  CBG monitoring, ED     Status: Abnormal   Collection Time: 04/20/18 11:36 AM  Result Value Ref Range   Glucose-Capillary 122 (H) 70 - 99 mg/dL  Glucose, capillary     Status: Abnormal   Collection Time: 04/20/18  5:08 PM  Result Value Ref Range   Glucose-Capillary 119 (H) 70 - 99 mg/dL    Ct Abdomen Pelvis W Contrast  Result Date: 04/19/2018 CLINICAL DATA:  Lower abdominal pain for the past 3-4 weeks, worse for the past 3 days. Ex-smoker. Previous prostatectomy for prostate cancer. Previous urethral sphincter surgery. EXAM: CT ABDOMEN AND PELVIS WITH CONTRAST TECHNIQUE: Multidetector CT imaging of the abdomen and pelvis was performed using the standard protocol following bolus administration of intravenous contrast. CONTRAST:  123mL ISOVUE-300 IOPAMIDOL (ISOVUE-300) INJECTION 61% COMPARISON:  07/08/2011. FINDINGS: Lower chest: Mild bibasilar linear atelectasis or scarring. Normal sized  heart. Hepatobiliary: No focal liver abnormality is  seen. No gallstones, gallbladder wall thickening, or biliary dilatation. Pancreas: Unremarkable. No pancreatic ductal dilatation or surrounding inflammatory changes. Spleen: Interval visualization of a 6 mm rounded area of low density in the superior aspect of the spleen. The spleen is normal in size and shape. Adrenals/Urinary Tract: Normal appearing adrenal glands. Bilateral renal calculi. The largest is in the lower pole of the left kidney, measuring 7 mm. Small bilateral renal cysts. Moderate dilatation of the left renal collecting system and ureter the level of the distal ureter at the ureterovesical junction. There are 2 calculi in ureter at that location, 1 measuring 5 mm in the other measures 4 mm. No dilatation of the right ureter or renal collecting system. No significant urine in the bladder with no bladder calculi visualized. There is a urethral cuff with associated tubing and reservoir. Stomach/Bowel: The right colon is distended with stool in the transverse colon is distended with gas. There are multiple proximal descending colon diverticula without evidence of diverticulitis. No obstructing mass is seen. Normal appearing appendix, small bowel and stomach. Mild diffuse low density distal esophageal wall thickening. Vascular/Lymphatic: Mild atheromatous arterial calcifications without aneurysm. Mildly enlarged left para-aortic lymph node at the level of the left kidney, with a short axis diameter of 10 mm on image number 38 series 2, without significant change. Otherwise, no enlarged lymph nodes are seen. Reproductive: Surgically absent prostate gland with surgical clips. Other: Small left inguinal hernia containing fat and small proximal right inguinal hernia containing fat. Musculoskeletal: Mild lumbar and lower thoracic spine degenerative changes. Proximal right femoral bone islands. Small cysts in both proximal femurs and right acetabulum. No  evidence of bony metastatic disease. IMPRESSION: 1. Two distal left ureteral calculi at the ureterovesical junction, causing moderate left hydronephrosis and hydroureter. 2. Multiple bilateral, nonobstructing renal calculi. 3. Single mildly enlarged left para-aortic lymph node. This is unchanged since 07/08/2011, compatible with a mildly reactive node rather than a metastatic node. 4. Mild diffuse low density distal esophageal wall thickening. This could be due to reflux esophagitis. 5. Colonic diverticulosis and ileus. Electronically Signed   By: Claudie Revering M.D.   On: 04/19/2018 09:47   Dg Abdomen Acute W/chest  Result Date: 04/20/2018 CLINICAL DATA:  Abdominal pain, distension, constipation, kidney stones EXAM: DG ABDOMEN ACUTE W/ 1V CHEST COMPARISON:  CT abdomen/pelvis dated 04/19/2018 FINDINGS: Lungs are clear.  No pleural effusion or pneumothorax. The heart is normal in size. Nonobstructive bowel gas pattern. No evidence of free air under the diaphragm on the upright view. Multiple dilated loops of colon, possibly reflecting adynamic colonic ileus. Contrast in the left renal collecting system with associated moderate left hydroureteronephrosis, likely reflecting delayed excretion secondary to known distal ureteral calculus at the UVJ. Pelvic surgical clips. IMPRESSION: Contrast in the left renal collecting system with associated moderate left hydronephrosis, likely reflecting delayed excretion secondary to known distal ureteral calculus at the UVJ. No evidence of small bowel obstruction or free air. No evidence of acute cardiopulmonary disease. Electronically Signed   By: Julian Hy M.D.   On: 04/20/2018 04:42    Review of Systems  Constitutional: Negative.  Negative for chills.  HENT: Negative.   Eyes: Negative.   Respiratory: Negative.   Cardiovascular: Negative.   Gastrointestinal: Positive for abdominal pain and nausea.  Genitourinary: Positive for flank pain.  Skin: Negative.    Neurological: Negative.   Endo/Heme/Allergies: Negative.   Psychiatric/Behavioral: Negative.    Blood pressure 133/73, pulse 72, temperature 98.6 F (37 C), temperature source  Oral, resp. rate 16, height 6' (1.829 m), weight 81 kg, SpO2 95 %. Physical Exam  Assessment/Plan:  1 - Urolithiasis - I am concerned his ARF and pain and GI symtpoms are all stemming from this. Reft left ureteroscopy to stoen free tomorrow. Risks, benefits, alternatives, expected peri-op course discussed. NPO p MN.   2 - Prostate Cancer - per medical oncology, no gross disease by current imaging.   3 - Stress Urinary Incontinence - sphincter working well.   4 - Acute Renal Failure - likely combination pre-renal + contrast nephropathy + unilateral obstruction. Treat stoen and hydrate as per above  Alexis Frock 04/20/2018, 5:59 PM

## 2018-04-20 NOTE — ED Triage Notes (Addendum)
Pt reports abd pain with distention and constipation. Pt reports no BM x 3 days. Pt is on pain medication for kidney stones and has taken 1 dose of mirilax without relief. Pt has CT yesterday

## 2018-04-20 NOTE — ED Notes (Signed)
Pt c/o increased pain at this time. MD made aware. New orders received.

## 2018-04-20 NOTE — ED Notes (Signed)
Pt reports passing gas and feeling much better. Pt given ice chips per MD approval.

## 2018-04-20 NOTE — ED Notes (Signed)
Report given to Burman Nieves, RN at [facility] [phone].

## 2018-04-20 NOTE — ED Notes (Signed)
[facility] has left at this time with patient.

## 2018-04-20 NOTE — H&P (Signed)
History and Physical    Nicholas Maldonado EQA:[id number] DOB: Jun 30, 1950 DOA: 04/20/2018  PCP: Lujean Amel, MD Patient coming from: home  Chief Complaint:   HPI: Nicholas Maldonado is a 68 y.o. male with medical history significant of type 2 diabetes, hypertension, hyperlipidemia, who presented with nausea, vomiting, abdominal pain.  Patient states that he started to have intermittent nausea, vomiting and abdominal pain 1 week ago.  Associated symptoms included initial loose stool followed by constipation for one week.  His abdomen pain was located to mid abdomen as well as  both lateral side abdomen area, aching pain, moderate pain with no radiation.  He endorses dysuria and left flank pain as well.  Denies recent sickness, sick contact, fevers, chills, pain/pressure, palpitations, shortness of breath, cough, wheezing, urinary frequency or urgency.  Patient went to see his PCP this week.  And a CT abdomen was performed on 04/19/2018, which suggested distal left ureteral calculi at UVJ causing moderate left hydronephrosis and hydroureter, multiple bilateral nonobstructing renal calculi, and colonic ileus. Patient was then evaluated by his urologist Dr Tresa Moore who planned for surgery in 1 week. However, patient had worsening of nausea, vomiting and abdominal pain last night and went to ED for evaluation.    In the emergency room, He was afebrile with stable hemodynamics.  Labs showed WBC 12.4, sodium 132, potassium 3.8, glucose 168, BUN 18, creatinine 2.06, lipase 30, negative UA, negative guaiac stool.  KUB suggested colonic ileus, moderate left hydroureteronephrosis no SBO or free air. Patient receive Dilaudid IV, Zofran IV and IV fluids.  ED provider contacted Dr. Tresa Moore who did not feel that patient needed any intervention at this time but states that if his condition deteriorate please reconsult them.  Patient was subsequently transferred to the [facility] long for further evaluation and management  Review of  Systems: As per HPI otherwise 10 point review of systems negative.  Review of Systems Otherwise negative except as per HPI, including: General: Denies fever, chills, night sweats or unintended weight loss. Resp: Denies cough, wheezing, shortness of breath. Cardiac: Denies chest pain, palpitations, orthopnea, paroxysmal nocturnal dyspnea. GI: Positive for abdominal pain, nausea, vomiting, diarrhea or constipation GU: Denies frequency, hesitancy or incontinence.  Positive for dysuria and left flank pain MS: Denies muscle aches, joint pain or swelling Neuro: Denies headache, neurologic deficits (focal weakness, numbness, tingling), abnormal gait Psych: Denies anxiety, depression, SI/HI/AVH Skin: Denies new rashes or lesions ID: Denies sick contacts, exotic exposures, travel  Past Medical History:  Diagnosis Date   Diabetes mellitus    Has artificial bladder    Hypercholesterolemia    Hypertension     Past Surgical History:  Procedure Laterality Date   aus  Artificial urinary sphincter   BRAIN SURGERY     KIDNEY SURGERY     PROSTATE SURGERY      SOCIAL HISTORY:  reports that he has never smoked. He has never used smokeless tobacco. He reports current alcohol use. He reports that he does not use drugs.  Allergies  Allergen Reactions   Levaquin [Levofloxacin In D5w] Nausea And Vomiting   Percocet [Oxycodone-Acetaminophen] Nausea And Vomiting   Terbinafine Hcl Other (See Comments)    High liver count    FAMILY HISTORY: History reviewed. No pertinent family history.   Prior to Admission medications   Medication Sig Start Date End Date Taking? Authorizing Provider  ALPRAZolam (XANAX) 0.25 MG tablet Take 0.25 mg by mouth 2 (two) times daily as needed for anxiety. 11/28/17  Yes  [provider]  amoxicillin-clavulanate (AUGMENTIN) 875-125 MG tablet Take 1 tablet by mouth every 12 (twelve) hours. For 10 days 04/17/18  Yes [provider]  citalopram  (CELEXA) 20 MG tablet Take 20 mg by mouth daily. 03/18/18  Yes [provider]  famotidine (PEPCID) 20 MG tablet Take 20 mg by mouth daily.   Yes [provider]  HYDROcodone-acetaminophen (NORCO/VICODIN) 5-325 MG tablet Take 2 tablets by mouth every 4 (four) hours as needed. 06/12/15  Yes Tanna Furry, MD  insulin NPH-regular Human (70-30) 100 UNIT/ML injection Inject 12-18 Units into the skin 2 (two) times daily. Inject 12 units in the AM and 18 units in the PM   Yes [provider]  lisinopril (PRINIVIL,ZESTRIL) 10 MG tablet Take 10 mg by mouth daily. 03/07/18  Yes [provider]  metFORMIN (GLUCOPHAGE) 1000 MG tablet Take 1,000 mg by mouth 2 (two) times daily. 04/01/18  Yes [provider]  Multiple Vitamin (MULITIVITAMIN WITH MINERALS) TABS Take 1 tablet by mouth daily.   Yes [provider]  simvastatin (ZOCOR) 40 MG tablet Take 40 mg by mouth every evening. 01/31/18  Yes [provider]  tamsulosin (FLOMAX) 0.4 MG CAPS capsule Take 0.4 mg by mouth.   Yes [provider]    Physical Exam: Vitals:   04/20/18 0720 04/20/18 0924 04/20/18 1108 04/20/18 1252  BP: 127/73 124/68 127/80 133/73  Pulse: 82 78 72 72  Resp: 18 18 14 16   Temp: 98 F (36.7 C)  97.8 F (36.6 C) 98.6 F (37 C)  TempSrc: Oral  Oral Oral  SpO2: 98% 98% 97% 95%  Weight:    81 kg  Height:    6' (1.829 m)      Constitutional: NAD, calm, comfortable Eyes: PERRL, lids and conjunctivae normal ENMT: Mucous membranes are moist. Posterior pharynx clear of any exudate or lesions.Normal dentition.  Neck: normal, supple, no masses, no thyromegaly Respiratory: clear to auscultation bilaterally, no wheezing, no crackles. Normal respiratory effort. No accessory muscle use.  Cardiovascular: Regular rate and rhythm, no murmurs / rubs / gallops. No extremity edema. 2+ pedal pulses. No carotid bruits.  Abdomen: Midabdomen tenderness to palpation without rebound  tenderness or guarding, no masses palpated. No hepatosplenomegaly. Bowel sounds positive. Left CVA tenderness to palpation Musculoskeletal: no clubbing / cyanosis. No joint deformity upper and lower extremities. Good ROM, no contractures. Normal muscle tone.  Skin: no rashes, lesions, ulcers. No induration Neurologic: CN 2-12 grossly intact. Sensation intact, DTR normal. Strength 5/5 in all 4.  Psychiatric: Normal judgment and insight. Alert and oriented x 3. Normal mood.     Labs on Admission: I have personally reviewed following labs and imaging studies  CBC: Recent Labs  Lab 04/20/18 0419  WBC 12.4*  NEUTROABS 10.2*  HGB 13.6  HCT 42.0  MCV 88.1  PLT 983   Basic Metabolic Panel: Recent Labs  Lab 04/20/18 0419  [place] [place] 132*  K 3.8  CL 100  CO2 20*  GLUCOSE 168*  BUN 18  CREATININE 2.06*  CALCIUM 9.0   GFR: Estimated Creatinine Clearance: 37.7 mL/min (A) (by C-G formula based on SCr of 2.06 mg/dL (H)). Liver Function Tests: Recent Labs  Lab 04/20/18 0419  AST 20  ALT 16  ALKPHOS 56  BILITOT 1.3*  PROT 7.1  ALBUMIN 4.1   Recent Labs  Lab 04/20/18 0419  LIPASE 30   No results for input(s): AMMONIA in the last 168 hours. Coagulation Profile: No results for input(s): INR,  PROTIME in the last 168 hours. Cardiac Enzymes: No results for input(s): CKTOTAL, CKMB, CKMBINDEX, TROPONINI in the last 168 hours. BNP (last 3 results) No results for input(s): PROBNP in the last 8760 hours. HbA1C: No results for input(s): HGBA1C in the last 72 hours. CBG: Recent Labs  Lab 04/20/18 1136  GLUCAP 122*   Lipid Profile: No results for input(s): CHOL, HDL, LDLCALC, TRIG, CHOLHDL, LDLDIRECT in the last 72 hours. Thyroid Function Tests: No results for input(s): TSH, T4TOTAL, FREET4, T3FREE, THYROIDAB in the last 72 hours. Anemia Panel: No results for input(s): VITAMINB12, FOLATE, FERRITIN, TIBC, IRON, RETICCTPCT in the last 72 hours. Urine analysis:    Component Value  Date/Time   COLORURINE YELLOW 04/20/2018 0519   APPEARANCEUR CLEAR 04/20/2018 0519   LABSPEC 1.020 04/20/2018 0519   PHURINE 6.0 04/20/2018 0519   GLUCOSEU NEGATIVE 04/20/2018 0519   HGBUR SMALL (A) 04/20/2018 0519   BILIRUBINUR NEGATIVE 04/20/2018 0519   KETONESUR 15 (A) 04/20/2018 0519   PROTEINUR 30 (A) 04/20/2018 0519   UROBILINOGEN 0.2 07/07/2011 2200   NITRITE NEGATIVE 04/20/2018 0519   LEUKOCYTESUR NEGATIVE 04/20/2018 0519   Sepsis Labs: !!!!!!!!!!!!!!!!!!!!!!!!!!!!!!!!!!!!!!!!!!!! @LABRCNTIP (procalcitonin:4,lacticidven:4) )No results found for this or any previous visit (from the past 240 hour(s)).   Radiological Exams on Admission: Ct Abdomen Pelvis W Contrast  Result Date: 04/19/2018 CLINICAL DATA:  Lower abdominal pain for the past 3-4 weeks, worse for the past 3 days. Ex-smoker. Previous prostatectomy for prostate cancer. Previous urethral sphincter surgery. EXAM: CT ABDOMEN AND PELVIS WITH CONTRAST TECHNIQUE: Multidetector CT imaging of the abdomen and pelvis was performed using the standard protocol following bolus administration of intravenous contrast. CONTRAST:  178mL ISOVUE-300 IOPAMIDOL (ISOVUE-300) INJECTION 61% COMPARISON:  07/08/2011. FINDINGS: Lower chest: Mild bibasilar linear atelectasis or scarring. Normal sized heart. Hepatobiliary: No focal liver abnormality is seen. No gallstones, gallbladder wall thickening, or biliary dilatation. Pancreas: Unremarkable. No pancreatic ductal dilatation or surrounding inflammatory changes. Spleen: Interval visualization of a 6 mm rounded area of low density in the superior aspect of the spleen. The spleen is normal in size and shape. Adrenals/Urinary Tract: Normal appearing adrenal glands. Bilateral renal calculi. The largest is in the lower pole of the left kidney, measuring 7 mm. Small bilateral renal cysts. Moderate dilatation of the left renal collecting system and ureter the level of the distal ureter at the ureterovesical  junction. There are 2 calculi in ureter at that location, 1 measuring 5 mm in the other measures 4 mm. No dilatation of the right ureter or renal collecting system. No significant urine in the bladder with no bladder calculi visualized. There is a urethral cuff with associated tubing and reservoir. Stomach/Bowel: The right colon is distended with stool in the transverse colon is distended with gas. There are multiple proximal descending colon diverticula without evidence of diverticulitis. No obstructing mass is seen. Normal appearing appendix, small bowel and stomach. Mild diffuse low density distal esophageal wall thickening. Vascular/Lymphatic: Mild atheromatous arterial calcifications without aneurysm. Mildly enlarged left para-aortic lymph node at the level of the left kidney, with a short axis diameter of 10 mm on image number 38 series 2, without significant change. Otherwise, no enlarged lymph nodes are seen. Reproductive: Surgically absent prostate gland with surgical clips. Other: Small left inguinal hernia containing fat and small proximal right inguinal hernia containing fat. Musculoskeletal: Mild lumbar and lower thoracic spine degenerative changes. Proximal right femoral bone islands. Small cysts in both proximal femurs and right acetabulum. No evidence of bony metastatic disease. IMPRESSION:  1. Two distal left ureteral calculi at the ureterovesical junction, causing moderate left hydronephrosis and hydroureter. 2. Multiple bilateral, nonobstructing renal calculi. 3. Single mildly enlarged left para-aortic lymph node. This is unchanged since 07/08/2011, compatible with a mildly reactive node rather than a metastatic node. 4. Mild diffuse low density distal esophageal wall thickening. This could be due to reflux esophagitis. 5. Colonic diverticulosis and ileus. Electronically Signed   By: Claudie Revering M.D.   On: 04/19/2018 09:47   Dg Abdomen Acute W/chest  Result Date: 04/20/2018 CLINICAL DATA:   Abdominal pain, distension, constipation, kidney stones EXAM: DG ABDOMEN ACUTE W/ 1V CHEST COMPARISON:  CT abdomen/pelvis dated 04/19/2018 FINDINGS: Lungs are clear.  No pleural effusion or pneumothorax. The heart is normal in size. Nonobstructive bowel gas pattern. No evidence of free air under the diaphragm on the upright view. Multiple dilated loops of colon, possibly reflecting adynamic colonic ileus. Contrast in the left renal collecting system with associated moderate left hydroureteronephrosis, likely reflecting delayed excretion secondary to known distal ureteral calculus at the UVJ. Pelvic surgical clips. IMPRESSION: Contrast in the left renal collecting system with associated moderate left hydronephrosis, likely reflecting delayed excretion secondary to known distal ureteral calculus at the UVJ. No evidence of small bowel obstruction or free air. No evidence of acute cardiopulmonary disease. Electronically Signed   By: Julian Hy M.D.   On: 04/20/2018 04:42     All images have been reviewed by me personally.  EKG: Independently reviewed.   Assessment/Plan Principal Problem:   Urethral stone Active Problems:   Ileus (HCC)   Nausea & vomiting   Hydronephrosis with renal and ureteral calculus obstruction   Leukocytosis   Hyponatremia   Acute renal failure (ARF) (HCC)   Hypertension   Type 2 diabetes mellitus without complication (HCC)   # Nausea, vomiting and abdominal pain concerning for colonic ileus  - passing gas and has active BS on exam.  - denies N/V at present so will hold NG tube - NPO except for meds, ice and sips water - IVF hydration - consider repeat KUB and/or NG tube if he starts to have vomiting   # Left ureteral stone with hydronephrosis and hydroureter # mild leukocytosis with negative UA at ED POA  CT abd showed distal left ureteral calculi at UVJ causing moderate left hydronephrosis and hydroureter, multiple bilateral nonobstructing renal calculi, and  colonic ileus  -UA negative at the ED -Bladder scan was negative at the ED -per chart review, outside ED provider contacted urology Dr. Tresa Moore who did not feel that patient's left UVJ stones with hydronephrosis contributed to his creatinine elevation.  He does not feel that patient needs acute intervention at this time.  We will need to reconsult urology if patient condition deteriorates.    #Hyponatremia # ARF vs acute on chronic renal failure  Creatinine is 2.06 on admission, which is improved from renal function in 2013 though patient seems to have normal kidney function at baseline.  He did receive outpatient contrast on 3/13.     -BUN 18, creatinine 2.06 -Sodium 132 -UA negative for infection -Received IV fluid bolus at the ED -NS 35ml/h gentle hydration -BMP in the morning -Hold home lisinopril     # T2DM  - HGB A1C - Glu Ac and HS -Hold home insulin given decreased renal clearance and n.p.o. status   # HTN and HLD  - stable        DVT prophylaxis: heparin SQ Code Status: full code Family  Communication: no family at bedside Disposition Plan:  home Consults called: Urology Admission status: inpatient   Time Spent: 65 minutes.  >50% of the time was devoted to discussing the patients care, assessment, plan and disposition with other care givers along with counseling the patient about the risks and benefits of treatment.    Charlann Lange MD [facility]  If 7PM-7AM, please contact night-coverage [email or web address]  04/20/2018, 2:48 PM

## 2018-04-20 NOTE — ED Notes (Signed)
Report given to [facility] with [facility]. [facility] en route.

## 2018-04-20 NOTE — ED Notes (Signed)
ED Provider at bedside. 

## 2018-04-20 NOTE — ED Notes (Signed)
[phone], Diane, wife

## 2018-04-20 NOTE — ED Notes (Signed)
Updated patient and family regarding plan of care. Awaiting bed at this time. Pt resting easily.

## 2018-04-20 NOTE — ED Notes (Signed)
Belongings: glasses, watch, ring

## 2018-04-20 NOTE — ED Notes (Signed)
[facility] here at this time to transport patient.

## 2018-04-21 ENCOUNTER — Inpatient Hospital Stay (HOSPITAL_COMMUNITY): Payer: PPO | Admitting: Certified Registered Nurse Anesthetist

## 2018-04-21 ENCOUNTER — Encounter (HOSPITAL_COMMUNITY)
Admission: EM | Disposition: A | Discharge status: Home / Self Care | Payer: Self-pay | Source: Home / Self Care | Attending: Internal Medicine

## 2018-04-21 ENCOUNTER — Inpatient Hospital Stay (HOSPITAL_COMMUNITY): Payer: PPO

## 2018-04-21 ENCOUNTER — Encounter (HOSPITAL_COMMUNITY): Payer: Self-pay | Admitting: Certified Registered Nurse Anesthetist

## 2018-04-21 DIAGNOSIS — Z466 Encounter for fitting and adjustment of urinary device: Secondary | ICD-10-CM | POA: Diagnosis not present

## 2018-04-21 DIAGNOSIS — N211 Calculus in urethra: Secondary | ICD-10-CM

## 2018-04-21 DIAGNOSIS — N201 Calculus of ureter: Secondary | ICD-10-CM | POA: Diagnosis not present

## 2018-04-21 DIAGNOSIS — N132 Hydronephrosis with renal and ureteral calculous obstruction: Secondary | ICD-10-CM | POA: Diagnosis present

## 2018-04-21 DIAGNOSIS — N179 Acute kidney failure, unspecified: Secondary | ICD-10-CM | POA: Diagnosis not present

## 2018-04-21 HISTORY — PX: CYSTOSCOPY/URETEROSCOPY/HOLMIUM LASER/STENT PLACEMENT: SHX6546

## 2018-04-21 LAB — GLUCOSE, CAPILLARY
GLUCOSE-CAPILLARY: 122 mg/dL — AB (ref 70–99)
Glucose-Capillary: 89 mg/dL (ref 70–99)
Glucose-Capillary: 112 mg/dL — ABNORMAL HIGH (ref 70–99)
Glucose-Capillary: 135 mg/dL — ABNORMAL HIGH (ref 70–99)
Glucose-Capillary: 289 mg/dL — ABNORMAL HIGH (ref 70–99)

## 2018-04-21 LAB — SURGICAL PCR SCREEN
MRSA, PCR: POSITIVE — AB
Staphylococcus aureus: POSITIVE — AB

## 2018-04-21 LAB — HEMOGLOBIN A1C
HEMOGLOBIN A1C: 7 % — AB (ref 4.8–5.6)
Mean Plasma Glucose: 154.2 mg/dL

## 2018-04-21 LAB — COMPREHENSIVE METABOLIC PANEL
ALT: 12 U/L (ref 0–44)
AST: 15 U/L (ref 15–41)
Albumin: 3.3 g/dL — ABNORMAL LOW (ref 3.5–5.0)
Alkaline Phosphatase: 50 U/L (ref 38–126)
Anion gap: 11 (ref 5–15)
BUN: 21 mg/dL (ref 8–23)
CO2: 20 mmol/L — ABNORMAL LOW (ref 22–32)
Calcium: 8.5 mg/dL — ABNORMAL LOW (ref 8.9–10.3)
Chloride: 104 mmol/L (ref 98–111)
Creatinine, Ser: 1.98 mg/dL — ABNORMAL HIGH (ref 0.61–1.24)
GFR calc Af Amer: 39 mL/min — ABNORMAL LOW (ref >60)
GFR calc non Af Amer: 34 mL/min — ABNORMAL LOW (ref >60)
Glucose, Bld: 102 mg/dL — ABNORMAL HIGH (ref 70–99)
Potassium: 4.1 mmol/L (ref 3.5–5.1)
SODIUM: 135 mmol/L (ref 135–145)
Total Bilirubin: 1 mg/dL (ref 0.3–1.2)
Total Protein: 6.3 g/dL — ABNORMAL LOW (ref 6.5–8.1)

## 2018-04-21 LAB — PROTIME-INR
INR: 1 (ref 0.8–1.2)
Prothrombin Time: 13 seconds (ref 11.4–15.2)

## 2018-04-21 LAB — CBC
HCT: 37.4 % — ABNORMAL LOW (ref 39.0–52.0)
Hemoglobin: 12 g/dL — ABNORMAL LOW (ref 13.0–17.0)
MCH: 29.1 pg (ref 26.0–34.0)
MCHC: 32.1 g/dL (ref 30.0–36.0)
MCV: 90.6 fL (ref 80.0–100.0)
NRBC: 0 % (ref 0.0–0.2)
Platelets: 153 10*3/uL (ref 150–400)
RBC: 4.13 MIL/uL — ABNORMAL LOW (ref 4.22–5.81)
RDW: 12.8 % (ref 11.5–15.5)
WBC: 8.4 10*3/uL (ref 4.0–10.5)

## 2018-04-21 LAB — MAGNESIUM: Magnesium: 1.9 mg/dL (ref 1.7–2.4)

## 2018-04-21 LAB — PHOSPHORUS: Phosphorus: 3.3 mg/dL (ref 2.5–4.6)

## 2018-04-21 SURGERY — CYSTOSCOPY/URETEROSCOPY/HOLMIUM LASER/STENT PLACEMENT
Anesthesia: General | Site: Ureter | Laterality: Left

## 2018-04-21 MED ORDER — FENTANYL CITRATE (PF) 250 MCG/5ML IJ SOLN
INTRAMUSCULAR | Status: AC
  Filled 2018-04-21: qty 5

## 2018-04-21 MED ORDER — CEFAZOLIN SODIUM-DEXTROSE 2-4 GM/100ML-% IV SOLN
INTRAVENOUS | Status: AC
  Filled 2018-04-21: qty 100

## 2018-04-21 MED ORDER — ONDANSETRON HCL 4 MG/2ML IJ SOLN: 4.0000 mg | Freq: Four times a day (QID) | INTRAMUSCULAR | Status: DC | PRN

## 2018-04-21 MED ORDER — HYDROMORPHONE HCL 1 MG/ML IJ SOLN: 1.0000 mg | Freq: Once | INTRAMUSCULAR | Status: DC

## 2018-04-21 MED ORDER — CHLORHEXIDINE GLUCONATE CLOTH 2 % EX PADS
6.0000 | MEDICATED_PAD | Freq: Every day | CUTANEOUS | Status: AC
  Administered 2018-04-21: 6 via TOPICAL

## 2018-04-21 MED ORDER — FENTANYL CITRATE (PF) 100 MCG/2ML IJ SOLN: 25.0000 ug | INTRAMUSCULAR | Status: DC | PRN

## 2018-04-21 MED ORDER — SODIUM CHLORIDE 0.9 % IR SOLN
Status: DC | PRN
  Administered 2018-04-21: 4000 mL

## 2018-04-21 MED ORDER — MUPIROCIN 2 % EX OINT
TOPICAL_OINTMENT | Freq: Two times a day (BID) | CUTANEOUS | Status: DC
  Administered 2018-04-21: 23:00:00 via NASAL
  Administered 2018-04-21 – 2018-04-22 (×2): 1 via NASAL
  Filled 2018-04-21: qty 22

## 2018-04-21 MED ORDER — PHENYLEPHRINE 40 MCG/ML (10ML) SYRINGE FOR IV PUSH (FOR BLOOD PRESSURE SUPPORT)
PREFILLED_SYRINGE | INTRAVENOUS | Status: DC | PRN
  Administered 2018-04-21: 80 ug via INTRAVENOUS
  Administered 2018-04-21 (×2): 120 ug via INTRAVENOUS
  Administered 2018-04-21: 80 ug via INTRAVENOUS

## 2018-04-21 MED ORDER — DEXAMETHASONE SODIUM PHOSPHATE 10 MG/ML IJ SOLN
INTRAMUSCULAR | Status: DC | PRN
  Administered 2018-04-21: 5 mg via INTRAVENOUS

## 2018-04-21 MED ORDER — PROPOFOL 10 MG/ML IV BOLUS
INTRAVENOUS | Status: DC | PRN
  Administered 2018-04-21: 160 mg via INTRAVENOUS

## 2018-04-21 MED ORDER — FENTANYL CITRATE (PF) 100 MCG/2ML IJ SOLN
INTRAMUSCULAR | Status: DC | PRN
  Administered 2018-04-21: 50 ug via INTRAVENOUS

## 2018-04-21 MED ORDER — MIDAZOLAM HCL 2 MG/2ML IJ SOLN
INTRAMUSCULAR | Status: AC
  Filled 2018-04-21: qty 2

## 2018-04-21 MED ORDER — ONDANSETRON HCL 4 MG/2ML IJ SOLN
INTRAMUSCULAR | Status: AC
  Filled 2018-04-21: qty 2

## 2018-04-21 MED ORDER — EPHEDRINE SULFATE-NACL 50-0.9 MG/10ML-% IV SOSY
PREFILLED_SYRINGE | INTRAVENOUS | Status: DC | PRN
  Administered 2018-04-21: 10 mg via INTRAVENOUS

## 2018-04-21 MED ORDER — ONDANSETRON HCL 4 MG/2ML IJ SOLN
INTRAMUSCULAR | Status: DC | PRN
  Administered 2018-04-21: 4 mg via INTRAVENOUS

## 2018-04-21 MED ORDER — SODIUM CHLORIDE 0.9 % IV SOLN
INTRAVENOUS | Status: DC | PRN
  Administered 2018-04-21: 17 mL

## 2018-04-21 MED ORDER — CEFAZOLIN SODIUM-DEXTROSE 2-3 GM-%(50ML) IV SOLR
INTRAVENOUS | Status: DC | PRN
  Administered 2018-04-21: 2 g via INTRAVENOUS

## 2018-04-21 MED ORDER — LACTATED RINGERS IV SOLN
INTRAVENOUS | Status: DC | PRN
  Administered 2018-04-21: 10:00:00 via INTRAVENOUS

## 2018-04-21 MED ORDER — HEPARIN SODIUM (PORCINE) 5000 UNIT/ML IJ SOLN
5000.0000 [IU] | Freq: Three times a day (TID) | INTRAMUSCULAR | Status: DC
  Administered 2018-04-21 – 2018-04-22 (×3): 5000 [IU] via SUBCUTANEOUS
  Filled 2018-04-21 (×3): qty 1

## 2018-04-21 MED ORDER — LIDOCAINE 2% (20 MG/ML) 5 ML SYRINGE
INTRAMUSCULAR | Status: DC | PRN
  Administered 2018-04-21: 60 mg via INTRAVENOUS

## 2018-04-21 MED ORDER — CEFAZOLIN SODIUM-DEXTROSE 2-4 GM/100ML-% IV SOLN: 2.0000 g | Freq: Once | INTRAVENOUS | Status: DC

## 2018-04-21 MED ORDER — MIDAZOLAM HCL 5 MG/5ML IJ SOLN
INTRAMUSCULAR | Status: DC | PRN
  Administered 2018-04-21: 2 mg via INTRAVENOUS

## 2018-04-21 MED ORDER — PROPOFOL 10 MG/ML IV BOLUS
INTRAVENOUS | Status: AC
  Filled 2018-04-21: qty 20

## 2018-04-21 SURGICAL SUPPLY — 23 items
BAG URO CATCHER STRL LF (MISCELLANEOUS) ×3 IMPLANT
BASKET LASER NITINOL 1.9FR (BASKET) ×3 IMPLANT
BASKET STONE NCOMPASS (UROLOGICAL SUPPLIES) ×3 IMPLANT
CATH INTERMIT  6FR 70CM (CATHETERS) ×3 IMPLANT
CLOTH BEACON ORANGE TIMEOUT ST (SAFETY) ×3 IMPLANT
FIBER LASER TRAC TIP (UROLOGICAL SUPPLIES) ×3 IMPLANT
GLOVE BIO SURGEON STRL SZ 6.5 (GLOVE) ×2 IMPLANT
GLOVE BIO SURGEONS STRL SZ 6.5 (GLOVE) ×1
GLOVE BIOGEL M STRL SZ7.5 (GLOVE) ×3 IMPLANT
GLOVE BIOGEL PI IND STRL 6.5 (GLOVE) ×1 IMPLANT
GLOVE BIOGEL PI INDICATOR 6.5 (GLOVE) ×2
GOWN STRL REUS W/TWL LRG LVL3 (GOWN DISPOSABLE) ×9 IMPLANT
GUIDEWIRE ANG ZIPWIRE 038X150 (WIRE) ×3 IMPLANT
GUIDEWIRE STR DUAL SENSOR (WIRE) ×6 IMPLANT
KIT TURNOVER KIT A (KITS) ×3 IMPLANT
MANIFOLD NEPTUNE II (INSTRUMENTS) ×3 IMPLANT
PACK CYSTO (CUSTOM PROCEDURE TRAY) ×3 IMPLANT
SHEATH URETERAL 12FRX35CM (MISCELLANEOUS) ×3 IMPLANT
STENT POLARIS 5FRX24 (STENTS) ×3 IMPLANT
SYR CONTROL 10ML LL (SYRINGE) ×3 IMPLANT
TUBE FEEDING 8FR 16IN STR KANG (MISCELLANEOUS) ×6 IMPLANT
TUBING CONNECTING 10 (TUBING) ×2 IMPLANT
TUBING CONNECTING 10' (TUBING) ×1

## 2018-04-21 NOTE — Brief Op Note (Signed)
04/21/2018  10:32 AM  PATIENT:  Radene Ou  68 y.o. male  PRE-OPERATIVE DIAGNOSIS:  Left ureteral stone  POST-OPERATIVE DIAGNOSIS:  Left ureteral stone  PROCEDURE:  Procedure(s): CYSTOSCOPY/URETEROSCOPY/HOLMIUM LASER/STENT PLACEMENT (Left)  SURGEON:  Surgeon(s) and Role:    Alexis Frock, MD - Primary  PHYSICIAN ASSISTANT:   ASSISTANTS: none   ANESTHESIA:   general  EBL:  minimal   BLOOD ADMINISTERED:none  DRAINS: none   LOCAL MEDICATIONS USED:  NONE  SPECIMEN:  Source of Specimen:  Left renal / ureteral stones  DISPOSITION OF SPECIMEN:  [facility] for compositional analysis  COUNTS:  YES  TOURNIQUET:  * No tourniquets in log *  DICTATION: .Other Dictation: Dictation Number [id number]  PLAN OF CARE: Admit to inpatient   PATIENT DISPOSITION:  PACU - hemodynamically stable.   Delay start of Pharmacological VTE agent (>24hrs) due to surgical blood loss or risk of bleeding: yes

## 2018-04-21 NOTE — Anesthesia Procedure Notes (Signed)
Procedure Name: LMA Insertion Performed by: Mical Kicklighter J, CRNA Pre-anesthesia Checklist: Patient identified, Emergency Drugs available, Suction available, Patient being monitored and Timeout performed Patient Re-evaluated:Patient Re-evaluated prior to induction Oxygen Delivery Method: Circle system utilized Preoxygenation: Pre-oxygenation with 100% oxygen Induction Type: IV induction Ventilation: Mask ventilation without difficulty LMA: LMA inserted LMA Size: 4.0 Number of attempts: 1 Placement Confirmation: positive ETCO2 and breath sounds checked- equal and bilateral Tube secured with: Tape Dental Injury: Teeth and Oropharynx as per pre-operative assessment        

## 2018-04-21 NOTE — Anesthesia Postprocedure Evaluation (Signed)
Anesthesia Post Note  Patient: FELIS QUILLIN  Procedure(s) Performed: CYSTOSCOPY/URETEROSCOPY/HOLMIUM LASER/STENT PLACEMENT (Left Ureter)     Patient location during evaluation: PACU Anesthesia Type: General Level of consciousness: awake and alert Pain management: pain level controlled Vital Signs Assessment: post-procedure vital signs reviewed and stable Respiratory status: spontaneous breathing, nonlabored ventilation, respiratory function stable and patient connected to nasal cannula oxygen Cardiovascular status: blood pressure returned to baseline and stable Postop Assessment: no apparent nausea or vomiting Anesthetic complications: no    Last Vitals:  Vitals:   04/21/18 1115 04/21/18 1319  BP: 131/67 138/68  Pulse: 77 85  Resp: 13 16  Temp: 36.8 C 36.9 C  SpO2: 95% 95%    Last Pain:  Vitals:   04/21/18 1319  TempSrc: Oral  PainSc:                  Carleta Woodrow S

## 2018-04-21 NOTE — Progress Notes (Signed)
Subjective/Chief Complaint:  1 - Urolithiasis -  04/2018 - Left 4.73mm distal / UVJ stone x 2 with mild hydro, LLP 21mm non-obstruting by CT. UA w/o infectious parameters.   2 - Prostate Cancer - s/p open prostatectomy 2008 then adjuvant radiation. Gets intermitant androgen deprivation through medical oncology at [facility]. CT 04/2018 with seminal vesicals still in place.   3 - Stress Urinary Incontinence - s/p artifidial urinary sphincter previousyly, happy with result.   4 - Acute Renal Failure - Cr 2.06 up from baslien < 1.2 by ER labs 3/14. Admits to some nausea and poor PO intake. He did get contrast CT 04/19/18.  Today " [place] " is NPO for left ureteroscopy later this AM. Cr still up at 1.9. No interval fevers.    Objective: Vital signs in last 24 hours: Temp:  [97.7 F (36.5 C)-98.6 F (37 C)] 98.6 F (37 C) (03/15 0626) Pulse Rate:  [72-82] 82 (03/15 0626) Resp:  [14-18] 16 (03/15 0626) BP: (124-138)/(68-80) 138/77 (03/15 0626) SpO2:  [93 %-98 %] 97 % (03/15 0626) Weight:  [81 kg] 81 kg (03/14 1252) Last BM Date: 04/15/18  Intake/Output from previous day: 03/14 0701 - 03/15 0700 In: -  Out: 650 [Urine:650] Intake/Output this shift: No intake/output data recorded.  NAD, AOx3, resting Wearing glasses Non-labored brething on room air regualr heart rate Mild left CVAT SNTND SCD's in place   Lab Results:  Recent Labs    04/20/18 0419 04/21/18 0503  WBC 12.4* 8.4  HGB 13.6 12.0*  HCT 42.0 37.4*  PLT 161 153   BMET Recent Labs    04/20/18 0419 04/21/18 0503  NA 132* 135  K 3.8 4.1  CL 100 104  CO2 20* 20*  GLUCOSE 168* 102*  BUN 18 21  CREATININE 2.06* 1.98*  CALCIUM 9.0 8.5*   PT/INR Recent Labs    04/21/18 0558  LABPROT 13.0  INR 1.0   ABG No results for input(s): PHART, HCO3 in the last 72 hours.  Invalid input(s): PCO2, PO2  Studies/Results: Ct Abdomen Pelvis W Contrast  Result Date: 04/19/2018 CLINICAL DATA:  Lower abdominal pain  for the past 3-4 weeks, worse for the past 3 days. Ex-smoker. Previous prostatectomy for prostate cancer. Previous urethral sphincter surgery. EXAM: CT ABDOMEN AND PELVIS WITH CONTRAST TECHNIQUE: Multidetector CT imaging of the abdomen and pelvis was performed using the standard protocol following bolus administration of intravenous contrast. CONTRAST:  145mL ISOVUE-300 IOPAMIDOL (ISOVUE-300) INJECTION 61% COMPARISON:  07/08/2011. FINDINGS: Lower chest: Mild bibasilar linear atelectasis or scarring. Normal sized heart. Hepatobiliary: No focal liver abnormality is seen. No gallstones, gallbladder wall thickening, or biliary dilatation. Pancreas: Unremarkable. No pancreatic ductal dilatation or surrounding inflammatory changes. Spleen: Interval visualization of a 6 mm rounded area of low density in the superior aspect of the spleen. The spleen is normal in size and shape. Adrenals/Urinary Tract: Normal appearing adrenal glands. Bilateral renal calculi. The largest is in the lower pole of the left kidney, measuring 7 mm. Small bilateral renal cysts. Moderate dilatation of the left renal collecting system and ureter the level of the distal ureter at the ureterovesical junction. There are 2 calculi in ureter at that location, 1 measuring 5 mm in the other measures 4 mm. No dilatation of the right ureter or renal collecting system. No significant urine in the bladder with no bladder calculi visualized. There is a urethral cuff with associated tubing and reservoir. Stomach/Bowel: The right colon is distended with stool in the transverse colon  is distended with gas. There are multiple proximal descending colon diverticula without evidence of diverticulitis. No obstructing mass is seen. Normal appearing appendix, small bowel and stomach. Mild diffuse low density distal esophageal wall thickening. Vascular/Lymphatic: Mild atheromatous arterial calcifications without aneurysm. Mildly enlarged left para-aortic lymph node at the  level of the left kidney, with a short axis diameter of 10 mm on image number 38 series 2, without significant change. Otherwise, no enlarged lymph nodes are seen. Reproductive: Surgically absent prostate gland with surgical clips. Other: Small left inguinal hernia containing fat and small proximal right inguinal hernia containing fat. Musculoskeletal: Mild lumbar and lower thoracic spine degenerative changes. Proximal right femoral bone islands. Small cysts in both proximal femurs and right acetabulum. No evidence of bony metastatic disease. IMPRESSION: 1. Two distal left ureteral calculi at the ureterovesical junction, causing moderate left hydronephrosis and hydroureter. 2. Multiple bilateral, nonobstructing renal calculi. 3. Single mildly enlarged left para-aortic lymph node. This is unchanged since 07/08/2011, compatible with a mildly reactive node rather than a metastatic node. 4. Mild diffuse low density distal esophageal wall thickening. This could be due to reflux esophagitis. 5. Colonic diverticulosis and ileus. Electronically Signed   By: Claudie Revering M.D.   On: 04/19/2018 09:47   Dg Abdomen Acute W/chest  Result Date: 04/20/2018 CLINICAL DATA:  Abdominal pain, distension, constipation, kidney stones EXAM: DG ABDOMEN ACUTE W/ 1V CHEST COMPARISON:  CT abdomen/pelvis dated 04/19/2018 FINDINGS: Lungs are clear.  No pleural effusion or pneumothorax. The heart is normal in size. Nonobstructive bowel gas pattern. No evidence of free air under the diaphragm on the upright view. Multiple dilated loops of colon, possibly reflecting adynamic colonic ileus. Contrast in the left renal collecting system with associated moderate left hydroureteronephrosis, likely reflecting delayed excretion secondary to known distal ureteral calculus at the UVJ. Pelvic surgical clips. IMPRESSION: Contrast in the left renal collecting system with associated moderate left hydronephrosis, likely reflecting delayed excretion secondary  to known distal ureteral calculus at the UVJ. No evidence of small bowel obstruction or free air. No evidence of acute cardiopulmonary disease. Electronically Signed   By: Julian Hy M.D.   On: 04/20/2018 04:42    Anti-infectives: Anti-infectives (From admission, onward)   None      Assessment/Plan:  1 - Urolithiasis - I am concerned his ARF and pain and GI symtpoms are all stemming from this. Reft left ureteroscopy to stoen free today as planned. Risks, benefits, alternatives, expected peri-op course discussed previously and reiterated today.   2 - Prostate Cancer - per medical oncology, no gross disease by current imaging.   3 - Stress Urinary Incontinence - sphincter working well.   4 - Acute Renal Failure - likely combination pre-renal + contrast nephropathy + unilateral obstruction. Treat stoen and hydrate as per above  Alexis Frock 04/21/2018

## 2018-04-21 NOTE — Anesthesia Preprocedure Evaluation (Signed)
Anesthesia Evaluation  Patient identified by MRN, date of birth, ID band Patient awake    Reviewed: Allergy & Precautions, H&P , NPO status , Patient's Chart, lab work & pertinent test results  Airway Mallampati: II   Neck ROM: full    Dental   Pulmonary neg pulmonary ROS,    breath sounds clear to auscultation       Cardiovascular hypertension,  Rhythm:regular Rate:Normal     Neuro/Psych    GI/Hepatic   Endo/Other  diabetes, Type 2  Renal/GU Renal InsufficiencyRenal disease     Musculoskeletal   Abdominal   Peds  Hematology   Anesthesia Other Findings   Reproductive/Obstetrics                             Anesthesia Physical Anesthesia Plan  ASA: II  Anesthesia Plan: General   Post-op Pain Management:    Induction: Intravenous  PONV Risk Score and Plan: 2 and Ondansetron, Dexamethasone, Midazolam and Treatment may vary due to age or medical condition  Airway Management Planned: LMA  Additional Equipment:   Intra-op Plan:   Post-operative Plan:   Informed Consent: I have reviewed the patients History and Physical, chart, labs and discussed the procedure including the risks, benefits and alternatives for the proposed anesthesia with the patient or authorized representative who has indicated his/her understanding and acceptance.       Plan Discussed with: CRNA, Anesthesiologist and Surgeon  Anesthesia Plan Comments:         Anesthesia Quick Evaluation

## 2018-04-21 NOTE — Op Note (Signed)
NAME: Nicholas, Maldonado MEDICAL RECORD PY:[id number] ACCOUNT [id number] DATE OF BIRTH:Jun 23, 1950 FACILITY: [facility] LOCATION: [facility] PHYSICIAN:Sallyann Kinnaird, MD  OPERATIVE REPORT  DATE OF PROCEDURE:  04/21/2018  PREOPERATIVE DIAGNOSIS:  Left ureteral stone, refractory colic and acute renal failure.  PROCEDURE: 1.  Cystoscopy, left retrograde pyelogram, interpretation. 2.  Left ureteroscopy with laser lithotripsy. 3.  Insertion of left ureteral stent 5 x 24 Polaris with tether. , ESTIMATED BLOOD LOSS:  Nil.  COMPLICATIONS:  None.  SPECIMENS:  Left renal and ureteral stone and stone fragments for analysis.  FINDINGS: 1.  In situ, urinary sphincter.  Excellent mucosal coaptation without mucosal disruption pre and postprocedure. 2.  Surgical absence of the prostate. 3.  Rigid pelvis consistent with prior radiation. 4.  Left distal ureteral stone with mild hydroureteronephrosis. 5.  Multifocal intrarenal stones. 6.  Successful removal of all accessible stone fragments larger than one-third millimeter following laser lithotripsy and basket extraction. 7.  Successful placement of left ureteral stent proximal in the renal pelvis, distal end in urinary bladder.  INDICATIONS:  The patient is a pleasant 68 year old gentleman with history of locally advanced prostate cancer, status post surgery and radiation and now on intermittent androgen deprivation.  He also has a history of recurrent urolithiasis.  He was  found on workup for colicky left flank pain to have a left distal ureteral stone, actually multifocal,  as well as some left intrarenal stones.  He initially was planned for outpatient elective ureteroscopy for this; however, his colic became refractory  and his renal function declining.  Given this, option were discussed including recommended path of ureteroscopic stimulation with goal of stone free.  Today, he wished to proceed.  Informed consent was obtained and placed in medical  record.  DESCRIPTION OF PROCEDURE:  The patient being Nicholas Maldonado, procedure being left ureteroscopic stimulation was confirmed.  Procedure timeout was performed.  Antibiotics administered.  General anesthesia induced.  The patient was placed into a low lithotomy  position.  Sterile field was created prepped and draped base of the penis, perineum and proximal thighs using iodine and the patient in situ urinary sphincter was deactivated as per AMS-800 manufacturer's specifications, leaving a partially filled  dimple and cystourethroscopy was performed with a 22-French rigid cystoscope with offset lens.  Inspection of anterior and posterior urethra revealed an excellent mucosal coaptation the area of the bulbar urethra.  Surgical absence of the prostate.  The  bladder neck was quite rigid, consistent with prior radiation.  The left ureteral orifice was very difficult to initially appreciate given his surgical history and it was successfully cannulated with an angled ZIPwire allowing passage for open-ended  catheter and left retrograde pyelogram obtained.  Left retrograde pyelogram does a single left ureter single system left kidney.  There was a mobile filling defect in the distal ureter consistent with known stone.  There was mild hydroureteronephrosis above this.  A .038 ZIPwire was advanced to the  lower pole and set aside as a safety wire.  An 8-French feeding tube was placed in the urinary bladder for pressure release, and semirigid ureteroscopy of the distal left ureter alongside a separate sensor working wire.  There were 2 small distal  ureteral stones as I expected.  The smaller 1 amenable to simple basketing and was removed and set aside for composition analysis.  The larger stone was too large for simple basketing.  Holmium laser energy applied at a setting of 0.2 joules and 20 Hz  and it was fragmented into  2 smaller pieces that were then sequentially grasped and their long axis removed and set  aside for composite analysis.  Semirigid ureteroscopy of the remainder of the distal half of the left ureter revealed no additional  calcifications.  As the goal today was to render stone free, the semirigid scope was exchanged for a 12/14 medium length ureteral access sheath using continuous fluoroscopic guidance to the level of the proximal ureter and flexible digital ureteroscopy  was performed of the left proximal ureter and left kidney, including all calices x3 with a single channel ureteroscope.  There was multifocal mid and lower pole stones.  All of these actually appeared amenable to simple basketing and a combination of  escape basket and NCompass basket were used and all accessible stone fragments larger than 130 mm were successfully removed.  The access sheath was removed under continuous vision.  No significant mucosal abnormalities were found.  Given the patient's  recent acute renal failure, it was felt that brief interval stenting with a tethered stent would be warranted.  As such, a new 5 x 24 Polaris-type stent was placed remaining safety wire using fluoroscopic guidance.  Proximal and distal deployment were  good.  The sphincter was reactivated.  Anesthesia terminated.  The patient tolerated the procedure well.  No immediate perinatal complications.    The patient was taken to postanesthesia care in stable condition.  AN/NUANCE  D:04/21/2018 T:04/21/2018 JOB:[id number]

## 2018-04-21 NOTE — Progress Notes (Addendum)
PROGRESS NOTE    Nicholas Maldonado  JHE:[id number] DOB: [birth year] DOA: 04/20/2018 PCP: Lujean Amel, MD   Brief Narrative: Patient is a 68 year old male with history of prostate cancer status post prostatectomy, urinary incontinence, diabetes mellitus, hypertension, hyperlipidemia who presented to the emergency department with complaints of nausea, vomiting and abdominal pain.  He has been having the symptoms since last 1 week.  CT abdomen done as an outpatient on 04/19/2018 suggest distal left ureteral calculi at UVJ causing moderate left hydronephrosis and hydroureter, multiple bilateral nonobstructing renal calculi . Imagings done here showed two distal left ureteral calculi at the ureterovesical junction, causing moderate left hydronephrosis and hydroureter,multiple bilateral, nonobstructing renal calculi.  He was also found to have acute kidney injury on presentation and he was not eating or drinking for last 4 days.  Urology consulted.  Underwent cystoscopy/ureteroscopy and stent placement on 04/21/18 for left ureteral stone.  Assessment & Plan:   Principal Problem:   Urethral stone Active Problems:   Ileus (HCC)   Nausea & vomiting   Hydronephrosis with renal and ureteral calculus obstruction   Leukocytosis   Hyponatremia   Acute renal failure (ARF) (HCC)   Hypertension   Type 2 diabetes mellitus without complication (HCC)   Ureteral stone with hydronephrosis   Left ureteral stones/ left moderate hydronephrosis/hydroureter: Imagings done here showed two distal left ureteral calculi at the ureterovesical junction, causing moderate left hydronephrosis and hydroureter,multiple bilateral, nonobstructing renal calculi.  Underwent cystoscopy/ureteroscopy and stent placement on 04/21/18 for left ureteral stone.  Urology following. UA negative on presentation.  Nausea/vomiting/abdominal pain: Most likely associated with ureteral stone.  Anticipate improvement.  Continue pain medications as  needed. There was concern of ileus on previous imagings.  X-ray does not show any bowel obstruction.  Acute kidney injury: Most likely prerenal secondary to decreased oral intake.  Continue IV fluids.  Kidney function improving.  History of prostate cancer: S/P prostatectomy.Has urinary sphincter.  Type 2 diabetes mellitus: Hemoglobin A1c of 7.  Continue sliding scale insulin  Hypertension: Currently blood stable.  Resume home meds on discharge.  Hyperlipidemia: Continue statin on discharge         DVT prophylaxis: Heparin [place] Code Status: Full Family Communication: Family member present at the bedside Disposition Plan: Home after urology clearance   Consultants: Urology  Procedures:cystoscopy/ureteroscopy and stent placement  Antimicrobials:  Anti-infectives (From admission, onward)   Start     Dose/Rate Route Frequency Ordered Stop   04/21/18 0930  ceFAZolin (ANCEF) IVPB 2g/100 mL premix     2 g 200 mL/hr over 30 Minutes Intravenous  Once 04/21/18 0926     04/21/18 0926  ceFAZolin (ANCEF) 2-4 GM/100ML-% IVPB    Note to Pharmacy:  Herbie Drape   : cabinet override      04/21/18 0926 04/21/18 2129      Subjective: Patient seen and examined at bedside this morning.  Feels better today.  Abdominal pain is improved but is still there on the left lowe quadrant.  Denies any nausea or vomiting.Hasnot have a bowel movement today/  Objective: Vitals:   04/21/18 0626 04/21/18 1047 04/21/18 1100 04/21/18 1115  BP: 138/77 (!) 110/56 120/63 131/67  Pulse: 82 86 81 77  Resp: 16 12 13 13   Temp: 98.6 F (37 C) 98.3 F (36.8 C)  98.3 F (36.8 C)  TempSrc: Oral     SpO2: 97% 100% 100% 95%  Weight:      Height:        Intake/Output  Summary (Last 24 hours) at 04/21/2018 1140 Last data filed at 04/21/2018 1048 Gross per 24 hour  Intake 1300 ml  Output 650 ml  Net 650 ml   Filed Weights   04/20/18 0347 04/20/18 1252  Weight: 82.6 kg 81 kg    Examination:  General  exam: Appears calm and comfortable ,Not in distress,average built HEENT:PERRL,Oral mucosa moist, Ear/Nose normal on gross exam Respiratory system: Bilateral equal air entry, normal vesicular breath sounds, no wheezes or crackles  Cardiovascular system: S1 & S2 heard, RRR. No JVD, murmurs, rubs, gallops or clicks. No pedal edema. Gastrointestinal system: Abdomen is nondistended, soft and nontender. No organomegaly or masses felt. Normal bowel sounds heard. Central nervous system: Alert and oriented. No focal neurological deficits. Extremities: No edema, no clubbing ,no cyanosis, distal peripheral pulses palpable. Skin: No rashes, lesions or ulcers,no icterus ,no pallor MSK: Normal muscle bulk,tone ,power Psychiatry: Judgement and insight appear normal. Mood & affect appropriate.     Data Reviewed: I have personally reviewed following labs and imaging studies  CBC: Recent Labs  Lab 04/20/18 0419 04/21/18 0503  WBC 12.4* 8.4  NEUTROABS 10.2*  --   HGB 13.6 12.0*  HCT 42.0 37.4*  MCV 88.1 90.6  PLT 161 357   Basic Metabolic Panel: Recent Labs  Lab 04/20/18 0419 04/21/18 0503  NA 132* 135  K 3.8 4.1  CL 100 104  CO2 20* 20*  GLUCOSE 168* 102*  BUN 18 21  CREATININE 2.06* 1.98*  CALCIUM 9.0 8.5*  MG  --  1.9  PHOS  --  3.3   GFR: Estimated Creatinine Clearance: 39.2 mL/min (A) (by C-G formula based on SCr of 1.98 mg/dL (H)). Liver Function Tests: Recent Labs  Lab 04/20/18 0419 04/21/18 0503  AST 20 15  ALT 16 12  ALKPHOS 56 50  BILITOT 1.3* 1.0  PROT 7.1 6.3*  ALBUMIN 4.1 3.3*   Recent Labs  Lab 04/20/18 0419  LIPASE 30   No results for input(s): AMMONIA in the last 168 hours. Coagulation Profile: Recent Labs  Lab 04/21/18 0558  INR 1.0   Cardiac Enzymes: No results for input(s): CKTOTAL, CKMB, CKMBINDEX, TROPONINI in the last 168 hours. BNP (last 3 results) No results for input(s): PROBNP in the last 8760 hours. HbA1C: Recent Labs    04/21/18  0503  HGBA1C 7.0*   CBG: Recent Labs  Lab 04/20/18 2126 04/20/18 2352 04/21/18 0401 04/21/18 0753 04/21/18 1058  GLUCAP 115* 104* 89 112* 122*   Lipid Profile: No results for input(s): CHOL, HDL, LDLCALC, TRIG, CHOLHDL, LDLDIRECT in the last 72 hours. Thyroid Function Tests: No results for input(s): TSH, T4TOTAL, FREET4, T3FREE, THYROIDAB in the last 72 hours. Anemia Panel: No results for input(s): VITAMINB12, FOLATE, FERRITIN, TIBC, IRON, RETICCTPCT in the last 72 hours. Sepsis Labs: No results for input(s): PROCALCITON, LATICACIDVEN in the last 168 hours.  Recent Results (from the past 240 hour(s))  Surgical pcr screen     Status: Abnormal   Collection Time: 04/21/18  3:11 AM  Result Value Ref Range Status   MRSA, PCR POSITIVE (A) NEGATIVE Final   Staphylococcus aureus POSITIVE (A) NEGATIVE Final    Comment: RESULT CALLED TO, READ BACK BY AND VERIFIED WITH: (NOTE) The Xpert SA Assay (FDA approved for NASAL specimens in patients 45 years of age and older), is one component of a comprehensive surveillance program. It is not intended to diagnose infection nor to guide or monitor treatment. Performed at [facility], [place]  [address], [place], [place] [address]          Radiology Studies: Dg Abdomen Acute W/chest  Result Date: 04/20/2018 CLINICAL DATA:  Abdominal pain, distension, constipation, kidney stones EXAM: DG ABDOMEN ACUTE W/ 1V CHEST COMPARISON:  CT abdomen/pelvis dated 04/19/2018 FINDINGS: Lungs are clear.  No pleural effusion or pneumothorax. The heart is normal in size. Nonobstructive bowel gas pattern. No evidence of free air under the diaphragm on the upright view. Multiple dilated loops of colon, possibly reflecting adynamic colonic ileus. Contrast in the left renal collecting system with associated moderate left hydroureteronephrosis, likely reflecting delayed excretion secondary to known distal ureteral calculus at the UVJ. Pelvic  surgical clips. IMPRESSION: Contrast in the left renal collecting system with associated moderate left hydronephrosis, likely reflecting delayed excretion secondary to known distal ureteral calculus at the UVJ. No evidence of small bowel obstruction or free air. No evidence of acute cardiopulmonary disease. Electronically Signed   By: Julian Hy M.D.   On: 04/20/2018 04:42   Dg C-arm 1-60 Min-no Report  Result Date: 04/21/2018 Fluoroscopy was utilized by the requesting physician.  No radiographic interpretation.        Scheduled Meds: . [MAR Hold] citalopram  20 mg Oral Daily  . [MAR Hold] heparin  5,000 Units Subcutaneous Q8H  . [MAR Hold] HYDROmorphone  1 mg Intravenous Once  . [MAR Hold] mupirocin ointment   Nasal BID  . [MAR Hold] tamsulosin  0.4 mg Oral Daily   Continuous Infusions: . sodium chloride Stopped (04/21/18 1006)  . ceFAZolin    .  ceFAZolin (ANCEF) IV       LOS: 1 day    Time spent: 35 mins.More than 50% of that time was spent in counseling and/or coordination of care.      Shelly Coss, MD [facility] Pager [phone]  If 7PM-7AM, please contact night-coverage [email or web address] Password [email or web address] 04/21/2018, 11:40 AM

## 2018-04-21 NOTE — Transfer of Care (Signed)
Immediate Anesthesia Transfer of Care Note  Patient: Nicholas Maldonado  Procedure(s) Performed: CYSTOSCOPY/URETEROSCOPY/HOLMIUM LASER/STENT PLACEMENT (Left Ureter)  Patient Location: PACU  Anesthesia Type:General  Level of Consciousness: sedated, patient cooperative and responds to stimulation  Airway & Oxygen Therapy: Patient Spontanous Breathing and Patient connected to face mask oxygen  Post-op Assessment: Report given to RN and Post -op Vital signs reviewed and stable  Post vital signs: Reviewed and stable  Last Vitals:  Vitals Value Taken Time  BP    Temp    Pulse    Resp    SpO2      Last Pain:  Vitals:   04/21/18 0626  TempSrc: Oral  PainSc:          Complications: No apparent anesthesia complications

## 2018-04-22 ENCOUNTER — Encounter (HOSPITAL_COMMUNITY): Payer: Self-pay | Admitting: Urology

## 2018-04-22 LAB — GLUCOSE, CAPILLARY
Glucose-Capillary: 222 mg/dL — ABNORMAL HIGH (ref 70–99)
Glucose-Capillary: 181 mg/dL — ABNORMAL HIGH (ref 70–99)
Glucose-Capillary: 163 mg/dL — ABNORMAL HIGH (ref 70–99)
Glucose-Capillary: 249 mg/dL — ABNORMAL HIGH (ref 70–99)

## 2018-04-22 LAB — BASIC METABOLIC PANEL
Anion gap: 9 (ref 5–15)
BUN: 29 mg/dL — ABNORMAL HIGH (ref 8–23)
CO2: 22 mmol/L (ref 22–32)
Calcium: 8.9 mg/dL (ref 8.9–10.3)
Chloride: 104 mmol/L (ref 98–111)
Creatinine, Ser: 1.71 mg/dL — ABNORMAL HIGH (ref 0.61–1.24)
GFR calc Af Amer: 47 mL/min — ABNORMAL LOW (ref >60)
GFR calc non Af Amer: 40 mL/min — ABNORMAL LOW (ref >60)
Glucose, Bld: 190 mg/dL — ABNORMAL HIGH (ref 70–99)
Potassium: 4.5 mmol/L (ref 3.5–5.1)
Sodium: 135 mmol/L (ref 135–145)

## 2018-04-22 NOTE — Discharge Instructions (Signed)
Acute Kidney Injury, Adult ° °Acute kidney injury is a sudden worsening of kidney function. The kidneys are organs that have several jobs. They filter the blood to remove waste products and extra fluid. They also maintain a healthy balance of minerals and hormones in the body, which helps control blood pressure and keep bones strong. With this condition, your kidneys do not do their jobs as well as they should. °This condition ranges from mild to severe. Over time it may develop into long-lasting (chronic) kidney disease. Early detection and treatment may prevent acute kidney injury from developing into a chronic condition. °What are the causes? °Common causes of this condition include: °· A problem with blood flow to the kidneys. This may be caused by: °? Low blood pressure (hypotension) or shock. °? Blood loss. °? Heart and blood vessel (cardiovascular) disease. °? Severe burns. °? Liver disease. °· Direct damage to the kidneys. This may be caused by: °? Certain medicines. °? A kidney infection. °? Poisoning. °? Being around or in contact with toxic substances. °? A surgical wound. °? A hard, direct hit to the kidney area. °· A sudden blockage of urine flow. This may be caused by: °? Cancer. °? Kidney stones. °? An enlarged prostate in males. °What are the signs or symptoms? °Symptoms of this condition may not be obvious until the condition becomes severe. Symptoms of this condition can include: °· Tiredness (lethargy), or difficulty staying awake. °· Nausea or vomiting. °· Swelling (edema) of the face, legs, ankles, or feet. °· Problems with urination, such as: °? Abdominal pain, or pain along the side of your stomach (flank). °? Decreased urine production. °? Decrease in the force of urine flow. °· Muscle twitches and cramps, especially in the legs. °· Confusion or trouble concentrating. °· Loss of appetite. °· Fever. °How is this diagnosed? °This condition may be diagnosed with tests, including: °· Blood  tests. °· Urine tests. °· Imaging tests. °· A test in which a sample of tissue is removed from the kidneys to be examined under a microscope (kidney biopsy). °How is this treated? °Treatment for this condition depends on the cause and how severe the condition is. In mild cases, treatment may not be needed. The kidneys may heal on their own. In more severe cases, treatment will involve: °· Treating the cause of the kidney injury. This may involve changing any medicines you are taking or adjusting your dosage. °· Fluids. You may need specialized IV fluids to balance your body's needs. °· Having a catheter placed to drain urine and prevent blockages. °· Preventing problems from occurring. This may mean avoiding certain medicines or procedures that can cause further injury to the kidneys. °In some cases treatment may also require: °· A procedure to remove toxic wastes from the body (dialysis or continuous renal replacement therapy - CRRT). °· Surgery. This may be done to repair a torn kidney, or to remove the blockage from the urinary system. °Follow these instructions at home: °Medicines °· Take over-the-counter and prescription medicines only as told by your health care provider. °· Do not take any new medicines without your health care provider's approval. Many medicines can worsen your kidney damage. °· Do not take any vitamin and mineral supplements without your health care provider's approval. Many nutritional supplements can worsen your kidney damage. °Lifestyle °· If your health care provider prescribed changes to your diet, follow them. You may need to decrease the amount of protein you eat. °· Achieve and maintain a healthy   weight. If you need help with this, ask your health care provider. °· Start or continue an exercise plan. Try to exercise at least 30 minutes a day, 5 days a week. °· Do not use any tobacco products, such as cigarettes, chewing tobacco, and e-cigarettes. If you need help quitting, ask your  health care provider. °General instructions °· Keep track of your blood pressure. Report changes in your blood pressure as told by your health care provider. °· Stay up to date with immunizations. Ask your health care provider which immunizations you need. °· Keep all follow-up visits as told by your health care provider. This is important. °Where to find more information °· American Association of Kidney Patients: www.aakp.org °· National Kidney Foundation: www.kidney.org °· American Kidney Fund: www.akfinc.org °· Life Options Rehabilitation Program: °? www.lifeoptions.org °? www.kidneyschool.org °Contact a health care provider if: °· Your symptoms get worse. °· You develop new symptoms. °Get help right away if: °· You develop symptoms of worsening kidney disease, which include: °? Headaches. °? Abnormally dark or light skin. °? Easy bruising. °? Frequent hiccups. °? Chest pain. °? Shortness of breath. °? End of menstruation in women. °? Seizures. °? Confusion or altered mental status. °? Abdominal or back pain. °? Itchiness. °· You have a fever. °· Your body is producing less urine. °· You have pain or bleeding when you urinate. °Summary °· Acute kidney injury is a sudden worsening of kidney function. °· Acute kidney injury can be caused by problems with blood flow to the kidneys, direct damage to the kidneys, and sudden blockage of urine flow. °· Symptoms of this condition may not be obvious until it becomes severe. Symptoms may include edema, lethargy, confusion, nausea or vomiting, and problems passing urine. °· This condition can usually be diagnosed with blood tests, urine tests, and imaging tests. Sometimes a kidney biopsy is done to diagnose this condition. °· Treatment for this condition often involves treating the underlying cause. It is treated with fluids, medicines, dialysis, diet changes, or surgery. °This information is not intended to replace advice given to you by your health care provider. Make  sure you discuss any questions you have with your health care provider. °Document Released: 08/08/2010 Document Revised: 05/25/2016 Document Reviewed: 01/14/2016 °Elsevier Interactive Patient Education © 2019 Elsevier Inc. ° °

## 2018-04-22 NOTE — Discharge Summary (Signed)
Physician Discharge Summary  Nicholas Maldonado JJH:[id number] DOB: Aug 12, 1950 DOA: 04/20/2018  PCP: Lujean Amel, MD  Admit date: 04/20/2018 Discharge date: 04/22/2018  Admitted From: Home Disposition:  Home  Discharge Condition:Stable CODE STATUS:FULL Diet recommendation: Carb Modified  Brief/Interim Summary: Patient is a 68 year old male with history of prostate cancer status post prostatectomy, urinary incontinence, diabetes mellitus, hypertension, hyperlipidemia who presented to the emergency department with complaints of nausea, vomiting and abdominal pain.  He has been having the symptoms since last 1 week.  CT abdomen done as an outpatient on 04/19/2018 suggest distal left ureteral calculi at UVJ causing moderate left hydronephrosis and hydroureter, multiple bilateral nonobstructing renal calculi . Imagings done here showed two distal left ureteral calculi at the ureterovesical junction, causing moderate left hydronephrosis and hydroureter,multiple bilateral, nonobstructing renal calculi.  He was also found to have acute kidney injury on presentation and he was not eating or drinking for last 4 days.Started on IV fluids.  Urology consulted.  Underwent cystoscopy/ureteroscopy and stent placement on 04/21/18 for left ureteral stone. Kidney function has significantly improved. Patient is hemodynamically stable for discharge today to home.  Discussed with urology and cleared for discharge.  Following problems were addressed during his hospitalization:  Left ureteral stones/ left moderate hydronephrosis/hydroureter: Imagings done here showed two distal left ureteral calculi at the ureterovesical junction, causing moderate left hydronephrosis and hydroureter,multiple bilateral, nonobstructing renal calculi.  Underwent cystoscopy/ureteroscopy and stent placement on 04/21/18 for left ureteral stone.  Urology was following.he will follow-up with urology as an outpatient.  Currently is very  comfortable. UA negative on presentation.  Nausea/vomiting/abdominal pain: Most likely associated with ureteral stone.  Nausea, vomiting and abdominal pain have resolved.  There was concern of ileus on previous imagings.  X-ray does not show any bowel obstruction.  Acute kidney injury: Most likely prerenal secondary to decreased oral intake. Kidney function improved with IV fluids.Encouraged to take plenty of fluids at home.  History of prostate cancer: S/P prostatectomy.Has urinary sphincter.  Type 2 diabetes mellitus: Hemoglobin A1c of 7.  Continue home regimen.  Hypertension: Currently blood stable.  Resume home meds on discharge.  Hyperlipidemia: Continue statin on discharge   Discharge Diagnoses:  Principal Problem:   Urethral stone Active Problems:   Ileus (HCC)   Nausea & vomiting   Hydronephrosis with renal and ureteral calculus obstruction   Leukocytosis   Hyponatremia   Acute renal failure (ARF) (HCC)   Hypertension   Type 2 diabetes mellitus without complication ([place])   Ureteral stone with hydronephrosis    Discharge Instructions  Discharge Instructions    Diet - low sodium heart healthy   Complete by:  As directed    Discharge instructions   Complete by:  As directed    1)Follow up with your PCP in a week.  Do a BMP test during the follow-up to check your kidney function. 2) Follow-up with urology as an outpatient. 3)Take plenty of fluids at home.   Increase activity slowly   Complete by:  As directed      Allergies as of 04/22/2018      Reactions   Levaquin [levofloxacin In D5w] Nausea And Vomiting   Percocet [oxycodone-acetaminophen] Nausea And Vomiting   Terbinafine Hcl Other (See Comments)   High liver count      Medication List    STOP taking these medications   amoxicillin-clavulanate 875-125 MG tablet Commonly known as:  AUGMENTIN     TAKE these medications   ALPRAZolam 0.25 MG tablet Commonly known  as:  XANAX Take 0.25 mg by  mouth 2 (two) times daily as needed for anxiety.   citalopram 20 MG tablet Commonly known as:  CELEXA Take 20 mg by mouth daily.   famotidine 20 MG tablet Commonly known as:  PEPCID Take 20 mg by mouth daily.   HYDROcodone-acetaminophen 5-325 MG tablet Commonly known as:  NORCO/VICODIN Take 2 tablets by mouth every 4 (four) hours as needed.   insulin NPH-regular Human (70-30) 100 UNIT/ML injection Inject 12-18 Units into the skin 2 (two) times daily. Inject 12 units in the AM and 18 units in the PM   lisinopril 10 MG tablet Commonly known as:  PRINIVIL,ZESTRIL Take 10 mg by mouth daily.   metFORMIN 1000 MG tablet Commonly known as:  GLUCOPHAGE Take 1,000 mg by mouth 2 (two) times daily.   multivitamin with minerals Tabs tablet Take 1 tablet by mouth daily.   simvastatin 40 MG tablet Commonly known as:  ZOCOR Take 40 mg by mouth every evening.   tamsulosin 0.4 MG Caps capsule Commonly known as:  FLOMAX Take 0.4 mg by mouth.      Follow-up Information    Koirala, Dibas, MD. Schedule an appointment as soon as possible for a visit in 1 week(s).   Specialty:  Family Medicine Contact information: [address] [place] [address] [phone]          Allergies  Allergen Reactions  . Levaquin [Levofloxacin In D5w] Nausea And Vomiting  . Percocet [Oxycodone-Acetaminophen] Nausea And Vomiting  . Terbinafine Hcl Other (See Comments)    High liver count    Consultations:  Urology   Procedures/Studies: Ct Abdomen Pelvis W Contrast  Result Date: 04/19/2018 CLINICAL DATA:  Lower abdominal pain for the past 3-4 weeks, worse for the past 3 days. Ex-smoker. Previous prostatectomy for prostate cancer. Previous urethral sphincter surgery. EXAM: CT ABDOMEN AND PELVIS WITH CONTRAST TECHNIQUE: Multidetector CT imaging of the abdomen and pelvis was performed using the standard protocol following bolus administration of intravenous contrast. CONTRAST:   177mL ISOVUE-300 IOPAMIDOL (ISOVUE-300) INJECTION 61% COMPARISON:  07/08/2011. FINDINGS: Lower chest: Mild bibasilar linear atelectasis or scarring. Normal sized heart. Hepatobiliary: No focal liver abnormality is seen. No gallstones, gallbladder wall thickening, or biliary dilatation. Pancreas: Unremarkable. No pancreatic ductal dilatation or surrounding inflammatory changes. Spleen: Interval visualization of a 6 mm rounded area of low density in the superior aspect of the spleen. The spleen is normal in size and shape. Adrenals/Urinary Tract: Normal appearing adrenal glands. Bilateral renal calculi. The largest is in the lower pole of the left kidney, measuring 7 mm. Small bilateral renal cysts. Moderate dilatation of the left renal collecting system and ureter the level of the distal ureter at the ureterovesical junction. There are 2 calculi in ureter at that location, 1 measuring 5 mm in the other measures 4 mm. No dilatation of the right ureter or renal collecting system. No significant urine in the bladder with no bladder calculi visualized. There is a urethral cuff with associated tubing and reservoir. Stomach/Bowel: The right colon is distended with stool in the transverse colon is distended with gas. There are multiple proximal descending colon diverticula without evidence of diverticulitis. No obstructing mass is seen. Normal appearing appendix, small bowel and stomach. Mild diffuse low density distal esophageal wall thickening. Vascular/Lymphatic: Mild atheromatous arterial calcifications without aneurysm. Mildly enlarged left para-aortic lymph node at the level of the left kidney, with a short axis diameter of 10 mm on image number 38  series 2, without significant change. Otherwise, no enlarged lymph nodes are seen. Reproductive: Surgically absent prostate gland with surgical clips. Other: Small left inguinal hernia containing fat and small proximal right inguinal hernia containing fat. Musculoskeletal:  Mild lumbar and lower thoracic spine degenerative changes. Proximal right femoral bone islands. Small cysts in both proximal femurs and right acetabulum. No evidence of bony metastatic disease. IMPRESSION: 1. Two distal left ureteral calculi at the ureterovesical junction, causing moderate left hydronephrosis and hydroureter. 2. Multiple bilateral, nonobstructing renal calculi. 3. Single mildly enlarged left para-aortic lymph node. This is unchanged since 07/08/2011, compatible with a mildly reactive node rather than a metastatic node. 4. Mild diffuse low density distal esophageal wall thickening. This could be due to reflux esophagitis. 5. Colonic diverticulosis and ileus. Electronically Signed   By: Claudie Revering M.D.   On: 04/19/2018 09:47   Dg Abdomen Acute W/chest  Result Date: 04/20/2018 CLINICAL DATA:  Abdominal pain, distension, constipation, kidney stones EXAM: DG ABDOMEN ACUTE W/ 1V CHEST COMPARISON:  CT abdomen/pelvis dated 04/19/2018 FINDINGS: Lungs are clear.  No pleural effusion or pneumothorax. The heart is normal in size. Nonobstructive bowel gas pattern. No evidence of free air under the diaphragm on the upright view. Multiple dilated loops of colon, possibly reflecting adynamic colonic ileus. Contrast in the left renal collecting system with associated moderate left hydroureteronephrosis, likely reflecting delayed excretion secondary to known distal ureteral calculus at the UVJ. Pelvic surgical clips. IMPRESSION: Contrast in the left renal collecting system with associated moderate left hydronephrosis, likely reflecting delayed excretion secondary to known distal ureteral calculus at the UVJ. No evidence of small bowel obstruction or free air. No evidence of acute cardiopulmonary disease. Electronically Signed   By: Julian Hy M.D.   On: 04/20/2018 04:42   Dg C-arm 1-60 Min-no Report  Result Date: 04/21/2018 Fluoroscopy was utilized by the requesting physician.  No radiographic  interpretation.       Subjective: Patient seen and examined at bedside this morning.  Remains comfortable.  Hemodynamically stable for discharge.  Discharge Exam: Vitals:   04/21/18 2104 04/22/18 0530  BP: 135/67 119/68  Pulse: 74 61  Resp: 16 16  Temp: 98.6 F (37 C) 97.7 F (36.5 C)  SpO2: 94% 97%   Vitals:   04/21/18 1115 04/21/18 1319 04/21/18 2104 04/22/18 0530  BP: 131/67 138/68 135/67 119/68  Pulse: 77 85 74 61  Resp: 13 16 16 16   Temp: 98.3 F (36.8 C) 98.5 F (36.9 C) 98.6 F (37 C) 97.7 F (36.5 C)  TempSrc:  Oral Oral Oral  SpO2: 95% 95% 94% 97%  Weight:      Height:        General: Pt is alert, awake, not in acute distress Cardiovascular: RRR, S1/S2 +, no rubs, no gallops Respiratory: CTA bilaterally, no wheezing, no rhonchi Abdominal: Soft, NT, ND, bowel sounds + Extremities: no edema, no cyanosis    The results of significant diagnostics from this hospitalization (including imaging, microbiology, ancillary and laboratory) are listed below for reference.     Microbiology: Recent Results (from the past 240 hour(s))  Surgical pcr screen     Status: Abnormal   Collection Time: 04/21/18  3:11 AM  Result Value Ref Range Status   MRSA, PCR POSITIVE (A) NEGATIVE Final   Staphylococcus aureus POSITIVE (A) NEGATIVE Final    Comment: RESULT CALLED TO, READ BACK BY AND VERIFIED WITH: (NOTE) The Xpert SA Assay (FDA approved for NASAL specimens in patients 22 years of  age and older), is one component of a comprehensive surveillance program. It is not intended to diagnose infection nor to guide or monitor treatment. Performed at [facility], [place] [address] [place], [place] [address]      Labs: BNP (last 3 results) No results for input(s): BNP in the last 8760 hours. Basic Metabolic Panel: Recent Labs  Lab 04/20/18 0419 04/21/18 0503 04/22/18 0448  NA 132* 135 135  K 3.8 4.1 4.5  CL 100 104 104  CO2 20* 20* 22  GLUCOSE  168* 102* 190*  BUN 18 21 29*  CREATININE 2.06* 1.98* 1.71*  CALCIUM 9.0 8.5* 8.9  MG  --  1.9  --   PHOS  --  3.3  --    Liver Function Tests: Recent Labs  Lab 04/20/18 0419 04/21/18 0503  AST 20 15  ALT 16 12  ALKPHOS 56 50  BILITOT 1.3* 1.0  PROT 7.1 6.3*  ALBUMIN 4.1 3.3*   Recent Labs  Lab 04/20/18 0419  LIPASE 30   No results for input(s): AMMONIA in the last 168 hours. CBC: Recent Labs  Lab 04/20/18 0419 04/21/18 0503  WBC 12.4* 8.4  NEUTROABS 10.2*  --   HGB 13.6 12.0*  HCT 42.0 37.4*  MCV 88.1 90.6  PLT 161 153   Cardiac Enzymes: No results for input(s): CKTOTAL, CKMB, CKMBINDEX, TROPONINI in the last 168 hours. BNP: Invalid input(s): POCBNP CBG: Recent Labs  Lab 04/21/18 1956 04/22/18 0131 04/22/18 0425 04/22/18 0729 04/22/18 1139  GLUCAP 289* 222* 181* 163* 249*   D-Dimer No results for input(s): DDIMER in the last 72 hours. Hgb A1c Recent Labs    04/21/18 0503  HGBA1C 7.0*   Lipid Profile No results for input(s): CHOL, HDL, LDLCALC, TRIG, CHOLHDL, LDLDIRECT in the last 72 hours. Thyroid function studies No results for input(s): TSH, T4TOTAL, T3FREE, THYROIDAB in the last 72 hours.  Invalid input(s): FREET3 Anemia work up No results for input(s): VITAMINB12, FOLATE, FERRITIN, TIBC, IRON, RETICCTPCT in the last 72 hours. Urinalysis    Component Value Date/Time   COLORURINE YELLOW 04/20/2018 [place] 04/20/2018 0519   LABSPEC 1.020 04/20/2018 0519   PHURINE 6.0 04/20/2018 0519   GLUCOSEU NEGATIVE 04/20/2018 0519   HGBUR SMALL (A) 04/20/2018 0519   BILIRUBINUR NEGATIVE 04/20/2018 0519   KETONESUR 15 (A) 04/20/2018 0519   PROTEINUR 30 (A) 04/20/2018 0519   UROBILINOGEN 0.2 07/07/2011 2200   NITRITE NEGATIVE 04/20/2018 0519   LEUKOCYTESUR NEGATIVE 04/20/2018 0519   Sepsis Labs Invalid input(s): PROCALCITONIN,  WBC,  LACTICIDVEN Microbiology Recent Results (from the past 240 hour(s))  Surgical pcr screen      Status: Abnormal   Collection Time: 04/21/18  3:11 AM  Result Value Ref Range Status   MRSA, PCR POSITIVE (A) NEGATIVE Final   Staphylococcus aureus POSITIVE (A) NEGATIVE Final    Comment: RESULT CALLED TO, READ BACK BY AND VERIFIED WITH: (NOTE) The Xpert SA Assay (FDA approved for NASAL specimens in patients 39 years of age and older), is one component of a comprehensive surveillance program. It is not intended to diagnose infection nor to guide or monitor treatment. Performed at [facility], [place] [address] [place] [address], [place], [place] [address]     Please note: You were cared for by a hospitalist during your hospital stay. Once you are discharged, your primary care physician will handle any further medical issues. Please note that NO REFILLS for any discharge medications will be authorized once  you are discharged, as it is imperative that you return to your primary care physician (or establish a relationship with a primary care physician if you do not have one) for your post hospital discharge needs so that they can reassess your need for medications and monitor your lab values.    Time coordinating discharge: 40 minutes  SIGNED:   Shelly Coss, MD  [facility] 04/22/2018, 1:01 PM Pager [phone]  If 7PM-7AM, please contact night-coverage [email or web address] Password [email or web address]

## 2018-04-22 NOTE — Progress Notes (Signed)
Inpatient Diabetes Program Recommendations  AACE/ADA: New Consensus Statement on Inpatient Glycemic Control (2015)  Target Ranges:  Prepandial:   less than 140 mg/dL      Peak postprandial:   less than 180 mg/dL (1-2 hours)      Critically ill patients:  140 - 180 mg/dL   Lab Results  Component Value Date   GLUCAP 163 (H) 04/22/2018   HGBA1C 7.0 (H) 04/21/2018    Review of Glycemic Control Results for Nicholas Maldonado, Nicholas Maldonado (MRN [id number]) as of 04/22/2018 11:07  Ref. Range 04/21/2018 11:47 04/21/2018 19:56 04/22/2018 01:31 04/22/2018 04:25 04/22/2018 07:29  Glucose-Capillary Latest Ref Range: 70 - 99 mg/dL 135 (H) 289 (H) 222 (H) 181 (H) 163 (H)   Diabetes history: DM2 Outpatient Diabetes medications: Metformin 1 gm bid Current orders for Inpatient glycemic control: None  Inpatient Diabetes Program Recommendations:   -Add Novolog sensitive scale tid   Thank you, Nani Gasser. Tahara Ruffini, RN, MSN, CDE  Diabetes Coordinator Inpatient Glycemic Control Team Team Pager [phone] (8am-5pm) 04/22/2018 11:09 AM

## 2018-04-22 NOTE — Progress Notes (Signed)
Dr. Tawanna Solo notified of CBG-249, no orders given. Discharge instructions explained to patient. Dr. Tawanna Solo states the patient doesn't need to be on an antibiotic and urology will call the patient and let him know when the stent will be removed. Mrs. Rossa in to take patient home. Discharged via wheelchair.

## 2018-04-26 DIAGNOSIS — N183 Chronic kidney disease, stage 3 (moderate): Secondary | ICD-10-CM | POA: Diagnosis not present

## 2018-04-26 DIAGNOSIS — N201 Calculus of ureter: Secondary | ICD-10-CM | POA: Diagnosis not present

## 2018-05-02 DIAGNOSIS — N393 Stress incontinence (female) (male): Secondary | ICD-10-CM | POA: Diagnosis not present

## 2018-05-02 DIAGNOSIS — R1084 Generalized abdominal pain: Secondary | ICD-10-CM | POA: Diagnosis not present

## 2018-05-02 DIAGNOSIS — R300 Dysuria: Secondary | ICD-10-CM | POA: Diagnosis not present

## 2018-05-02 DIAGNOSIS — N202 Calculus of kidney with calculus of ureter: Secondary | ICD-10-CM | POA: Diagnosis not present

## 2018-05-31 DIAGNOSIS — R509 Fever, unspecified: Secondary | ICD-10-CM | POA: Diagnosis not present

## 2018-05-31 DIAGNOSIS — R05 Cough: Secondary | ICD-10-CM | POA: Diagnosis not present

## 2018-05-31 DIAGNOSIS — J988 Other specified respiratory disorders: Secondary | ICD-10-CM | POA: Diagnosis not present

## 2018-06-04 DIAGNOSIS — Z794 Long term (current) use of insulin: Secondary | ICD-10-CM | POA: Diagnosis not present

## 2018-06-04 DIAGNOSIS — R220 Localized swelling, mass and lump, head: Secondary | ICD-10-CM | POA: Diagnosis not present

## 2018-06-04 DIAGNOSIS — E11319 Type 2 diabetes mellitus with unspecified diabetic retinopathy without macular edema: Secondary | ICD-10-CM | POA: Diagnosis not present

## 2018-06-10 ENCOUNTER — Ambulatory Visit
Admission: RE | Admit: 2018-06-10 | Discharge: 2018-06-10 | Disposition: A | Discharge status: Home / Self Care | Payer: PPO | Source: Ambulatory Visit | Attending: Family Medicine | Admitting: Family Medicine

## 2018-06-10 ENCOUNTER — Other Ambulatory Visit: Payer: Self-pay | Admitting: Family Medicine

## 2018-06-10 DIAGNOSIS — J069 Acute upper respiratory infection, unspecified: Secondary | ICD-10-CM

## 2018-06-10 DIAGNOSIS — R05 Cough: Secondary | ICD-10-CM | POA: Diagnosis not present

## 2018-06-10 DIAGNOSIS — R0602 Shortness of breath: Secondary | ICD-10-CM | POA: Diagnosis not present

## 2018-06-12 DIAGNOSIS — J069 Acute upper respiratory infection, unspecified: Secondary | ICD-10-CM | POA: Diagnosis not present

## 2018-08-12 DIAGNOSIS — Z5181 Encounter for therapeutic drug level monitoring: Secondary | ICD-10-CM | POA: Diagnosis not present

## 2018-08-12 DIAGNOSIS — Z794 Long term (current) use of insulin: Secondary | ICD-10-CM | POA: Diagnosis not present

## 2018-08-12 DIAGNOSIS — E11319 Type 2 diabetes mellitus with unspecified diabetic retinopathy without macular edema: Secondary | ICD-10-CM | POA: Diagnosis not present

## 2018-08-12 DIAGNOSIS — E1122 Type 2 diabetes mellitus with diabetic chronic kidney disease: Secondary | ICD-10-CM | POA: Diagnosis not present

## 2018-09-16 DIAGNOSIS — R809 Proteinuria, unspecified: Secondary | ICD-10-CM | POA: Diagnosis not present

## 2018-10-01 DIAGNOSIS — M79674 Pain in right toe(s): Secondary | ICD-10-CM | POA: Diagnosis not present

## 2018-10-25 DIAGNOSIS — S30860A Insect bite (nonvenomous) of lower back and pelvis, initial encounter: Secondary | ICD-10-CM | POA: Diagnosis not present

## 2018-10-25 DIAGNOSIS — L821 Other seborrheic keratosis: Secondary | ICD-10-CM | POA: Diagnosis not present

## 2018-10-25 DIAGNOSIS — D225 Melanocytic nevi of trunk: Secondary | ICD-10-CM | POA: Diagnosis not present

## 2018-10-25 DIAGNOSIS — L570 Actinic keratosis: Secondary | ICD-10-CM | POA: Diagnosis not present

## 2018-10-25 DIAGNOSIS — X32XXXA Exposure to sunlight, initial encounter: Secondary | ICD-10-CM | POA: Diagnosis not present

## 2018-10-25 DIAGNOSIS — B078 Other viral warts: Secondary | ICD-10-CM | POA: Diagnosis not present

## 2018-10-25 DIAGNOSIS — L986 Other infiltrative disorders of the skin and subcutaneous tissue: Secondary | ICD-10-CM | POA: Diagnosis not present

## 2018-12-18 DIAGNOSIS — E119 Type 2 diabetes mellitus without complications: Secondary | ICD-10-CM | POA: Diagnosis not present

## 2018-12-18 DIAGNOSIS — H2513 Age-related nuclear cataract, bilateral: Secondary | ICD-10-CM | POA: Diagnosis not present

## 2019-01-29 DIAGNOSIS — M79674 Pain in right toe(s): Secondary | ICD-10-CM | POA: Diagnosis not present

## 2019-02-12 DIAGNOSIS — J069 Acute upper respiratory infection, unspecified: Secondary | ICD-10-CM | POA: Diagnosis not present

## 2019-02-12 DIAGNOSIS — Z20822 Contact with and (suspected) exposure to covid-19: Secondary | ICD-10-CM | POA: Diagnosis not present

## 2019-02-12 DIAGNOSIS — Z20828 Contact with and (suspected) exposure to other viral communicable diseases: Secondary | ICD-10-CM | POA: Diagnosis not present

## 2019-03-06 DIAGNOSIS — N1831 Chronic kidney disease, stage 3a: Secondary | ICD-10-CM | POA: Diagnosis not present

## 2019-03-06 DIAGNOSIS — Z0001 Encounter for general adult medical examination with abnormal findings: Secondary | ICD-10-CM | POA: Diagnosis not present

## 2019-03-06 DIAGNOSIS — E7800 Pure hypercholesterolemia, unspecified: Secondary | ICD-10-CM | POA: Diagnosis not present

## 2019-03-06 DIAGNOSIS — Z136 Encounter for screening for cardiovascular disorders: Secondary | ICD-10-CM | POA: Diagnosis not present

## 2019-03-06 DIAGNOSIS — R809 Proteinuria, unspecified: Secondary | ICD-10-CM | POA: Diagnosis not present

## 2019-03-06 DIAGNOSIS — C61 Malignant neoplasm of prostate: Secondary | ICD-10-CM | POA: Diagnosis not present

## 2019-03-06 DIAGNOSIS — Z794 Long term (current) use of insulin: Secondary | ICD-10-CM | POA: Diagnosis not present

## 2019-03-06 DIAGNOSIS — Z125 Encounter for screening for malignant neoplasm of prostate: Secondary | ICD-10-CM | POA: Diagnosis not present

## 2019-03-06 DIAGNOSIS — I10 Essential (primary) hypertension: Secondary | ICD-10-CM | POA: Diagnosis not present

## 2019-03-06 DIAGNOSIS — F419 Anxiety disorder, unspecified: Secondary | ICD-10-CM | POA: Diagnosis not present

## 2019-03-06 DIAGNOSIS — Z0000 Encounter for general adult medical examination without abnormal findings: Secondary | ICD-10-CM | POA: Diagnosis not present

## 2019-03-06 DIAGNOSIS — E1122 Type 2 diabetes mellitus with diabetic chronic kidney disease: Secondary | ICD-10-CM | POA: Diagnosis not present

## 2019-03-06 DIAGNOSIS — R1110 Vomiting, unspecified: Secondary | ICD-10-CM | POA: Diagnosis not present

## 2019-03-06 DIAGNOSIS — K228 Other specified diseases of esophagus: Secondary | ICD-10-CM | POA: Diagnosis not present

## 2019-03-10 DIAGNOSIS — Z794 Long term (current) use of insulin: Secondary | ICD-10-CM | POA: Diagnosis not present

## 2019-03-10 DIAGNOSIS — Z7189 Other specified counseling: Secondary | ICD-10-CM | POA: Diagnosis not present

## 2019-03-10 DIAGNOSIS — E11319 Type 2 diabetes mellitus with unspecified diabetic retinopathy without macular edema: Secondary | ICD-10-CM | POA: Diagnosis not present

## 2019-03-10 DIAGNOSIS — E1122 Type 2 diabetes mellitus with diabetic chronic kidney disease: Secondary | ICD-10-CM | POA: Diagnosis not present

## 2019-03-10 DIAGNOSIS — Z5181 Encounter for therapeutic drug level monitoring: Secondary | ICD-10-CM | POA: Diagnosis not present

## 2019-03-21 DIAGNOSIS — K529 Noninfective gastroenteritis and colitis, unspecified: Secondary | ICD-10-CM | POA: Diagnosis not present

## 2019-03-21 DIAGNOSIS — R142 Eructation: Secondary | ICD-10-CM | POA: Diagnosis not present

## 2019-03-21 DIAGNOSIS — R935 Abnormal findings on diagnostic imaging of other abdominal regions, including retroperitoneum: Secondary | ICD-10-CM | POA: Diagnosis not present

## 2019-03-21 DIAGNOSIS — K219 Gastro-esophageal reflux disease without esophagitis: Secondary | ICD-10-CM | POA: Diagnosis not present

## 2019-03-22 ENCOUNTER — Ambulatory Visit: Payer: HMO | Attending: Internal Medicine

## 2019-03-22 DIAGNOSIS — Z23 Encounter for immunization: Secondary | ICD-10-CM | POA: Insufficient documentation

## 2019-03-22 NOTE — Progress Notes (Signed)
   Covid-19 Vaccination Clinic  Name:  Nicholas Maldonado    MRN: [id number] DOB: Feb 22, 1950  03/22/2019  [name] was observed post Covid-19 immunization for 15 minutes without incidence. He was provided with Vaccine Information Sheet and instruction to access the V-Safe system.   [name] was instructed to call 911 with any severe reactions post vaccine: Marland Kitchen Difficulty breathing  . Swelling of your face and throat  . A fast heartbeat  . A bad rash all over your body  . Dizziness and weakness    Immunizations Administered    Name Date Dose VIS Date Route   Pfizer COVID-19 Vaccine 03/22/2019  3:11 PM 0.3 mL 01/17/2019 Intramuscular   Manufacturer: [place]   Lot: [id number]   [place]: [id number]

## 2019-03-29 ENCOUNTER — Other Ambulatory Visit: Payer: Self-pay | Admitting: Physician Assistant

## 2019-03-29 DIAGNOSIS — R933 Abnormal findings on diagnostic imaging of other parts of digestive tract: Secondary | ICD-10-CM

## 2019-04-07 ENCOUNTER — Other Ambulatory Visit: Payer: HMO

## 2019-04-14 ENCOUNTER — Ambulatory Visit: Payer: HMO | Attending: Internal Medicine

## 2019-04-14 ENCOUNTER — Other Ambulatory Visit: Payer: HMO

## 2019-04-14 DIAGNOSIS — Z23 Encounter for immunization: Secondary | ICD-10-CM | POA: Insufficient documentation

## 2019-04-14 NOTE — Progress Notes (Signed)
   Covid-19 Vaccination Clinic  Name:  Nicholas Maldonado    MRN: [id number] DOB: 06-May-1950  04/14/2019  Mr. Nicholas Maldonado was observed post Covid-19 immunization for 15 minutes without incident. He was provided with Vaccine Information Sheet and instruction to access the V-Safe system.   Nicholas Maldonado was instructed to call 911 with any severe reactions post vaccine: Marland Kitchen Difficulty breathing  . Swelling of face and throat  . A fast heartbeat  . A bad rash all over body  . Dizziness and weakness   Immunizations Administered    Name Date Dose VIS Date Route   Pfizer COVID-19 Vaccine 04/14/2019  4:52 PM 0.3 mL 01/17/2019 Intramuscular   Manufacturer: [place]   Lot: [id number]   [place]: [id number]

## 2019-04-15 ENCOUNTER — Ambulatory Visit
Admission: RE | Admit: 2019-04-15 | Discharge: 2019-04-15 | Disposition: A | Discharge status: Home / Self Care | Payer: HMO | Source: Ambulatory Visit | Attending: Physician Assistant | Admitting: Physician Assistant

## 2019-04-15 ENCOUNTER — Other Ambulatory Visit: Payer: Self-pay | Admitting: Physician Assistant

## 2019-04-15 DIAGNOSIS — K224 Dyskinesia of esophagus: Secondary | ICD-10-CM | POA: Diagnosis not present

## 2019-04-15 DIAGNOSIS — R933 Abnormal findings on diagnostic imaging of other parts of digestive tract: Secondary | ICD-10-CM

## 2019-04-25 ENCOUNTER — Other Ambulatory Visit: Payer: Self-pay | Admitting: *Deleted

## 2019-04-25 NOTE — Patient Outreach (Signed)
[facility]) Care Management Chronic Special Needs Program    04/25/2019  Name: Nicholas Maldonado, DOB: 1951/01/25  MRN: [id number]   Nicholas Maldonado is enrolled in a chronic special needs plan for diabetes.  Health risk assessment completed previously by client.  Interdisciplinary care plan created from information in electronic medical record.  Client also has history of hypertension.  RN care manager will send introductory letter with interdisciplinary care plan to primary care provider and client, along with education materials.  Assigned RN care manager will follow up within 3 months.  Goals    . Client understands the importance of follow-up with providers by attending scheduled visits     Per medical record review, client saw primary care provider >4 times in 2020 Client saw endocrinologist for 2 visits in 2020 Client saw optometrist 12/18/18 Continue to follow up with your providers    . Client will verbalize knowledge of self management of Hypertension as evidences by BP reading of 140/90 or less; or as defined by provider     Blood pressure 110/64 03/10/2019 RN care manager mailed EMMI education article "Controlling blood pressure through lifestyle" Take medications as prescribed and recommended by your provider Please ask your doctor " what is my target blood pressure" Follow up with your provider as recommended    . HEMOGLOBIN A1C < 7.0     Hgb AIC 7.4 on 03/06/19 Check blood sugar per provider recommendation Visit provider every 3-6 months as directed Hgb AIC level every 3-6 months Carbohydrate controlled meal planning Eye exam yearly Check feet daily Take diabetes medication as prescribed by provider Physical activity RN care manager mailed EMMI education article "Diabetes and diet"    . Maintain timely refills of diabetic medication as prescribed within the year .     Please take all medications as prescribed Please refill all medications on time so you  do not run out     . Obtain annual  Lipid Profile, LDL-C     Lipid panel completed on 03/06/19 LDL 64- this is at goal The goal for LDL is less than 70 mg/dl as you are at high risk for complications Try to avoid saturated fats, trans-fats and eat more fiber Follow up with your provider for scheduled visits and lab work    . Obtain Annual Eye (retinal)  Exam      Eye exam 12/18/18 Plan to have dilated eye exam every year     . Obtain Annual Foot Exam     Client reports foot exam twice yearly per Health Risk Assessment Please have your doctor examine your feet at each visit     . Obtain annual screen for micro albuminuria (urine) , nephropathy (kidney problems)     Microalbuminuria completed 03/06/19 Client reports on health risk assessment this is completed twice per year Continue to follow up with your provider for scheduled visits and any lab work, urine checks due    . Obtain Hemoglobin A1C at least 2 times per year     Hgb AIC 7.4 on 03/06/19 Client reports Hgb AIC checked twice yearly per Health Risk Assessment Please follow up with your provider to stay on schedule with any lab work    . Visit Primary Care Provider or Endocrinologist at least 2 times per year      Client saw primary care provider >4 times in 2020 Client saw endocrinologist for 2 visits in 2020 Continue to schedule any follow up visits with your provider  Jacqlyn Larsen [facility], [place] Coordinator [phone]

## 2019-05-27 ENCOUNTER — Other Ambulatory Visit: Payer: Self-pay | Admitting: *Deleted

## 2019-05-27 NOTE — Patient Outreach (Signed)
  [facility]) Care Management Chronic Special Needs Program    05/27/2019  Name: Nicholas Maldonado, DOB: 1950/03/23  MRN: [id number]   Mr. Clyde Godbolt is enrolled in a chronic special needs plan for Diabetes.  RN care manager placed outreach call to client for initial telephonic assessment, no answer to telephone, left voicemail requesting return phone call.  PLAN Outreach client in 2-3 weeks  Jacqlyn Larsen [facility], [place] Coordinator [phone]

## 2019-06-09 DIAGNOSIS — E11319 Type 2 diabetes mellitus with unspecified diabetic retinopathy without macular edema: Secondary | ICD-10-CM | POA: Diagnosis not present

## 2019-06-09 DIAGNOSIS — Z5181 Encounter for therapeutic drug level monitoring: Secondary | ICD-10-CM | POA: Diagnosis not present

## 2019-06-09 DIAGNOSIS — E1122 Type 2 diabetes mellitus with diabetic chronic kidney disease: Secondary | ICD-10-CM | POA: Diagnosis not present

## 2019-06-09 DIAGNOSIS — Z794 Long term (current) use of insulin: Secondary | ICD-10-CM | POA: Diagnosis not present

## 2019-06-13 ENCOUNTER — Other Ambulatory Visit: Payer: Self-pay | Admitting: *Deleted

## 2019-06-13 ENCOUNTER — Encounter: Payer: Self-pay | Admitting: *Deleted

## 2019-06-13 NOTE — Patient Outreach (Signed)
[facility]) Care Management Chronic Special Needs Program  06/13/2019  Name: Nicholas Maldonado DOB: 26-May-1950  MRN: [id number]  Mr. Nicholas Maldonado is enrolled in a chronic special needs plan for Diabetes. Chronic Care Management Coordinator telephoned client to review health risk assessment and to develop individualized care plan.  Introduced the chronic care management program, importance of client participation, and taking their care plan to all provider appointments and inpatient facilities.  Reviewed the transition of care process and possible referral to community care management.  Subjective: Client reports he lives with spouse, is independent in all aspects of his care, checks CBG 2 x daily with fasting ranges 95-130 and random ranges 130-140.  Client states he would like to stay more mobile and would like education articles on stretching. Client sees Dr. Buddy Duty endocrinologist and saw last week with no changes made, had AIC checked last week but not sure of the result yet. Client states he does not recall having HTA calendar or 24 hour nurse line magnet and requests both of these be mailed.   Goals Addressed            This Visit's Progress   . "To stay mobile" (pt-stated)       Continue to do your walking and work around the house Keep up the good work Consulting civil engineer mailed EMMI education articles "Stretching exercises for upper body"  "Stretching exercises for lower body"    . Client understands the importance of follow-up with providers by attending scheduled visits   On track    Per medical record review, client saw primary care provider >4 times in 2020 Client saw endocrinologist for 2 visits in 2020 and 1 visit in 2021 Client saw optometrist 12/18/18 Continue to follow up with your providers    . Client will verbalize knowledge of self management of Hypertension as evidences by BP reading of 140/90 or less; or as defined by provider   On track    Blood pressure  110/64 03/10/2019 Take medications as prescribed and recommended by your provider Please ask your doctor " what is my target blood pressure" Follow up with your provider as recommended    . HEMOGLOBIN A1C < 7.0       Hgb AIC 7.4 on 03/06/19 Check blood sugar per provider recommendation Visit provider every 3-6 months as directed Hgb AIC level every 3-6 months Carbohydrate controlled meal planning Eye exam yearly Check feet daily Take diabetes medication as prescribed by provider Physical activity RN care manager EMMI education article " Diabetes meal planning" Call 24 hour nurse advice line as needed at [phone]     . Maintain timely refills of diabetic medication as prescribed within the year .   On track    Take medications as prescribed. Follow up with your health care provider if you have any questions. Please contact your assigned RN care manager if you have difficulty obtaining medications. Review of electronic medical record medication dispense report indicates client maintains timely refills of diabetic medications.     . Obtain annual  Lipid Profile, LDL-C   On track    Per medical record review, Lipid profile completed on 03/06/19 LDL= 64 The goal for LDL is less than 70mg /dl as you are at high risk for complications. Try to avoid saturated fats, trans-fats and eat more fiber. Continue to follow up with your health care provider for any needed lab work.     . Obtain Annual Eye (retinal)  Exam  On track    Eye exam 12/18/18 Plan to have dilated eye exam every year     . Obtain Annual Foot Exam   On track    Client states provider checks feet 2 times per year. Per medical record review, foot exam completed on Your doctor should check your feet at least once a year. Plan to schedule a foot exam with your health care provider once every year. Check your skin and feet daily for cuts, bruises, redness, blisters or sore.      . Obtain annual screen for micro  albuminuria (urine) , nephropathy (kidney problems)       Per medical record review, microalbuminuria completed on 03/06/19 Continue to obtain yearly physicals and lab checks as recommended by your health care provider. It is important for your health care provider to check your urine for protein at least once every year.        Plan:  RN care manager faxed today's note with updated individualized care plan to primary care provider, mailed updated individualized care plan to client's home along with education materials, consent form, HTA calendar and 24 hour nurse advice line magnet.  Chronic care management coordination will outreach in:  9-12 months.   Kassie Mends Nursing/RN Coord [facility] Case Manager, C-SNP  [phone]

## 2019-07-01 DIAGNOSIS — L820 Inflamed seborrheic keratosis: Secondary | ICD-10-CM | POA: Diagnosis not present

## 2019-08-12 DIAGNOSIS — C61 Malignant neoplasm of prostate: Secondary | ICD-10-CM | POA: Diagnosis not present

## 2019-08-12 DIAGNOSIS — N393 Stress incontinence (female) (male): Secondary | ICD-10-CM | POA: Diagnosis not present

## 2019-12-23 ENCOUNTER — Other Ambulatory Visit: Payer: Self-pay | Admitting: *Deleted

## 2019-12-23 NOTE — Patient Outreach (Signed)
  [facility]) Care Management Chronic Special Needs Program    12/23/2019  Name: Nicholas Maldonado, DOB: [birth year]  MRN: [id number]   Mr. Nicholas Maldonado is enrolled in a chronic special needs plan for Diabetes.  [facility] care management will continue to provide services for this member through 02/06/20.  [facility] [employer] care management team will assume care 02/07/20.   Nicholas Maldonado [facility], BSN [place], [place]

## 2020-01-12 DIAGNOSIS — Z794 Long term (current) use of insulin: Secondary | ICD-10-CM | POA: Diagnosis not present

## 2020-01-12 DIAGNOSIS — E11319 Type 2 diabetes mellitus with unspecified diabetic retinopathy without macular edema: Secondary | ICD-10-CM | POA: Diagnosis not present

## 2020-01-12 DIAGNOSIS — L659 Nonscarring hair loss, unspecified: Secondary | ICD-10-CM | POA: Diagnosis not present

## 2020-01-12 DIAGNOSIS — E1122 Type 2 diabetes mellitus with diabetic chronic kidney disease: Secondary | ICD-10-CM | POA: Diagnosis not present

## 2020-02-03 DIAGNOSIS — M545 Low back pain, unspecified: Secondary | ICD-10-CM | POA: Diagnosis not present

## 2020-02-11 ENCOUNTER — Other Ambulatory Visit: Payer: Self-pay | Admitting: *Deleted

## 2020-02-20 DIAGNOSIS — M65351 Trigger finger, right little finger: Secondary | ICD-10-CM | POA: Diagnosis not present

## 2020-02-20 DIAGNOSIS — M79644 Pain in right finger(s): Secondary | ICD-10-CM | POA: Diagnosis not present

## 2020-03-17 DIAGNOSIS — I10 Essential (primary) hypertension: Secondary | ICD-10-CM | POA: Diagnosis not present

## 2020-03-17 DIAGNOSIS — Z79899 Other long term (current) drug therapy: Secondary | ICD-10-CM | POA: Diagnosis not present

## 2020-03-17 DIAGNOSIS — F419 Anxiety disorder, unspecified: Secondary | ICD-10-CM | POA: Diagnosis not present

## 2020-03-17 DIAGNOSIS — Z8546 Personal history of malignant neoplasm of prostate: Secondary | ICD-10-CM | POA: Diagnosis not present

## 2020-03-17 DIAGNOSIS — Z0001 Encounter for general adult medical examination with abnormal findings: Secondary | ICD-10-CM | POA: Diagnosis not present

## 2020-03-17 DIAGNOSIS — N1831 Chronic kidney disease, stage 3a: Secondary | ICD-10-CM | POA: Diagnosis not present

## 2020-03-17 DIAGNOSIS — E1122 Type 2 diabetes mellitus with diabetic chronic kidney disease: Secondary | ICD-10-CM | POA: Diagnosis not present

## 2020-03-17 DIAGNOSIS — E7800 Pure hypercholesterolemia, unspecified: Secondary | ICD-10-CM | POA: Diagnosis not present

## 2020-04-08 ENCOUNTER — Other Ambulatory Visit: Payer: Self-pay | Admitting: Family Medicine

## 2020-04-08 DIAGNOSIS — R7401 Elevation of levels of liver transaminase levels: Secondary | ICD-10-CM

## 2020-04-22 ENCOUNTER — Other Ambulatory Visit: Payer: HMO

## 2020-05-03 DIAGNOSIS — E119 Type 2 diabetes mellitus without complications: Secondary | ICD-10-CM | POA: Diagnosis not present

## 2020-05-03 DIAGNOSIS — H2513 Age-related nuclear cataract, bilateral: Secondary | ICD-10-CM | POA: Diagnosis not present

## 2020-05-03 DIAGNOSIS — H43813 Vitreous degeneration, bilateral: Secondary | ICD-10-CM | POA: Diagnosis not present

## 2020-05-10 ENCOUNTER — Ambulatory Visit: Payer: HMO

## 2020-05-11 ENCOUNTER — Ambulatory Visit: Payer: HMO | Admitting: *Deleted

## 2020-05-13 ENCOUNTER — Ambulatory Visit
Admission: RE | Admit: 2020-05-13 | Discharge: 2020-05-13 | Disposition: A | Discharge status: Home / Self Care | Payer: HMO | Source: Ambulatory Visit | Attending: Family Medicine | Admitting: Family Medicine

## 2020-05-13 DIAGNOSIS — K7689 Other specified diseases of liver: Secondary | ICD-10-CM | POA: Diagnosis not present

## 2020-05-13 DIAGNOSIS — N200 Calculus of kidney: Secondary | ICD-10-CM | POA: Diagnosis not present

## 2020-05-13 DIAGNOSIS — K760 Fatty (change of) liver, not elsewhere classified: Secondary | ICD-10-CM | POA: Diagnosis not present

## 2020-05-13 DIAGNOSIS — R7401 Elevation of levels of liver transaminase levels: Secondary | ICD-10-CM

## 2020-06-14 DIAGNOSIS — J069 Acute upper respiratory infection, unspecified: Secondary | ICD-10-CM | POA: Diagnosis not present

## 2020-07-01 DIAGNOSIS — R5383 Other fatigue: Secondary | ICD-10-CM | POA: Diagnosis not present

## 2020-07-01 DIAGNOSIS — I951 Orthostatic hypotension: Secondary | ICD-10-CM | POA: Diagnosis not present

## 2020-07-12 DIAGNOSIS — E1122 Type 2 diabetes mellitus with diabetic chronic kidney disease: Secondary | ICD-10-CM | POA: Diagnosis not present

## 2020-07-12 DIAGNOSIS — Z794 Long term (current) use of insulin: Secondary | ICD-10-CM | POA: Diagnosis not present

## 2020-07-12 DIAGNOSIS — E11319 Type 2 diabetes mellitus with unspecified diabetic retinopathy without macular edema: Secondary | ICD-10-CM | POA: Diagnosis not present

## 2020-07-12 DIAGNOSIS — Z7984 Long term (current) use of oral hypoglycemic drugs: Secondary | ICD-10-CM | POA: Diagnosis not present

## 2020-07-16 DIAGNOSIS — I10 Essential (primary) hypertension: Secondary | ICD-10-CM | POA: Diagnosis not present

## 2020-07-16 DIAGNOSIS — R002 Palpitations: Secondary | ICD-10-CM | POA: Diagnosis not present

## 2020-11-05 DIAGNOSIS — M65351 Trigger finger, right little finger: Secondary | ICD-10-CM | POA: Diagnosis not present

## 2020-11-05 DIAGNOSIS — M19042 Primary osteoarthritis, left hand: Secondary | ICD-10-CM | POA: Diagnosis not present

## 2020-11-15 DIAGNOSIS — M545 Low back pain, unspecified: Secondary | ICD-10-CM | POA: Diagnosis not present

## 2020-12-03 DIAGNOSIS — M19042 Primary osteoarthritis, left hand: Secondary | ICD-10-CM | POA: Diagnosis not present

## 2020-12-10 DIAGNOSIS — L570 Actinic keratosis: Secondary | ICD-10-CM | POA: Diagnosis not present

## 2020-12-10 DIAGNOSIS — D225 Melanocytic nevi of trunk: Secondary | ICD-10-CM | POA: Diagnosis not present

## 2020-12-10 DIAGNOSIS — X32XXXD Exposure to sunlight, subsequent encounter: Secondary | ICD-10-CM | POA: Diagnosis not present

## 2021-01-12 DIAGNOSIS — Z7984 Long term (current) use of oral hypoglycemic drugs: Secondary | ICD-10-CM | POA: Diagnosis not present

## 2021-01-12 DIAGNOSIS — E11319 Type 2 diabetes mellitus with unspecified diabetic retinopathy without macular edema: Secondary | ICD-10-CM | POA: Diagnosis not present

## 2021-01-12 DIAGNOSIS — Z794 Long term (current) use of insulin: Secondary | ICD-10-CM | POA: Diagnosis not present

## 2021-01-12 DIAGNOSIS — E1122 Type 2 diabetes mellitus with diabetic chronic kidney disease: Secondary | ICD-10-CM | POA: Diagnosis not present

## 2021-01-26 DIAGNOSIS — N3001 Acute cystitis with hematuria: Secondary | ICD-10-CM | POA: Diagnosis not present

## 2021-01-31 DIAGNOSIS — U071 COVID-19: Secondary | ICD-10-CM | POA: Diagnosis not present

## 2021-01-31 DIAGNOSIS — R051 Acute cough: Secondary | ICD-10-CM | POA: Diagnosis not present

## 2021-01-31 DIAGNOSIS — N3001 Acute cystitis with hematuria: Secondary | ICD-10-CM | POA: Diagnosis not present

## 2021-01-31 DIAGNOSIS — J069 Acute upper respiratory infection, unspecified: Secondary | ICD-10-CM | POA: Diagnosis not present

## 2021-01-31 DIAGNOSIS — R5383 Other fatigue: Secondary | ICD-10-CM | POA: Diagnosis not present

## 2021-02-04 IMAGING — CT CT ABDOMEN AND PELVIS WITH CONTRAST
2 of 4 series · 13 of 46 positions shown, 15 images · IV contrast (iopamidol)
Comparison: 07/08/2011.

CLINICAL DATA: Lower abdominal pain for the past 3-4 weeks, worse
for the past 3 days. Ex-smoker. Previous prostatectomy for prostate
cancer. Previous urethral sphincter surgery.

EXAM:
CT ABDOMEN AND PELVIS WITH CONTRAST
TECHNIQUE: Multidetector CT imaging of the abdomen and pelvis was performed
using the standard protocol following bolus administration of
intravenous contrast.
CONTRAST:  100mL O8PNAT-L99 IOPAMIDOL (O8PNAT-L99) INJECTION 61%

[Series 6: abd pelvis 2.00 br40 s3 cor · coronal · 0.71mm/px · 3 of 155 slices shown]
[im 52/155  soft-tissue]
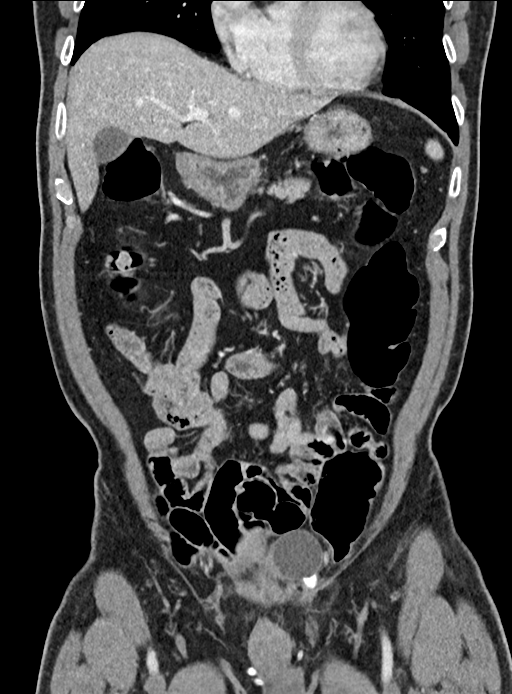
[im 69/155  soft-tissue]
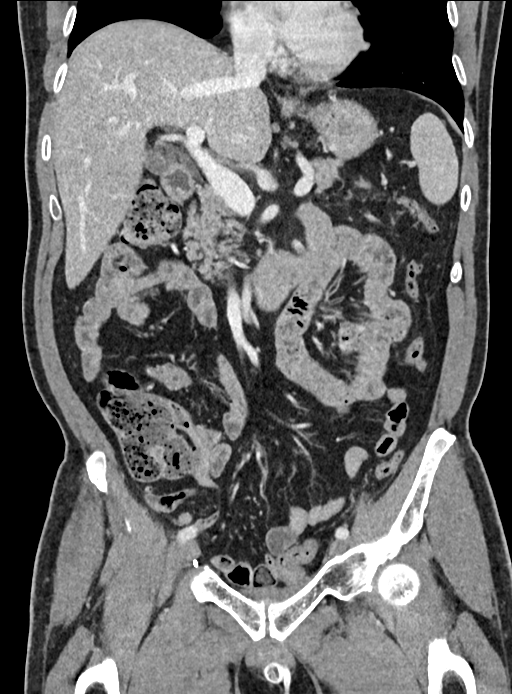
[im 86/155  soft-tissue]
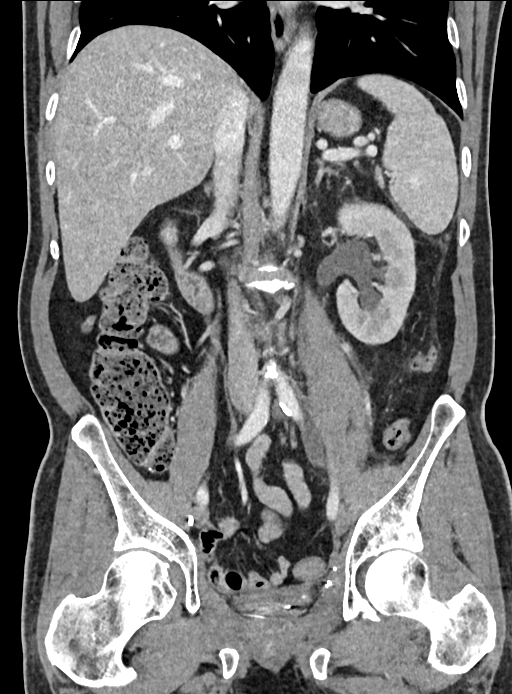

[Series 11: delay 5.00 br40 s3 ax · axial · delayed · 0.60mm/px · z∈[+1314,+1484]mm · 10 of 39 slices shown, 12 images]
[im 3/39  soft-tissue]
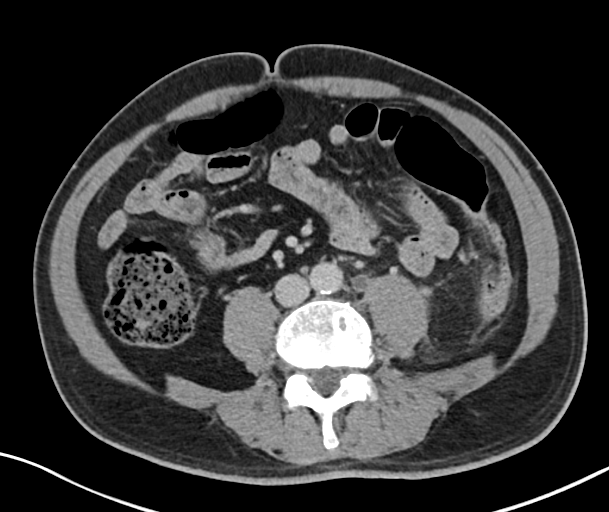
[im 3/39  bone]
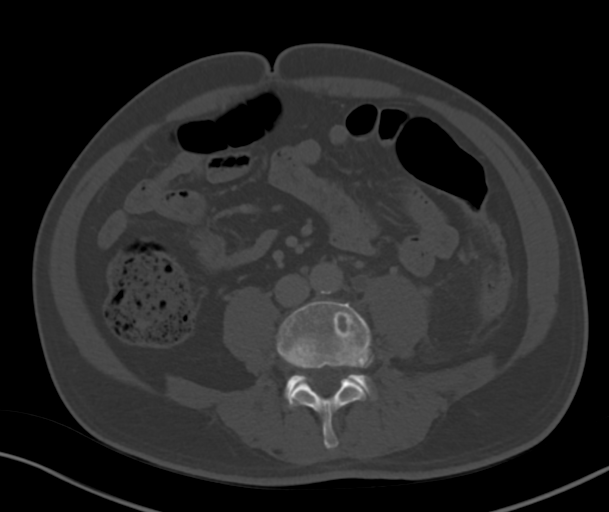
[im 7/39  soft-tissue]
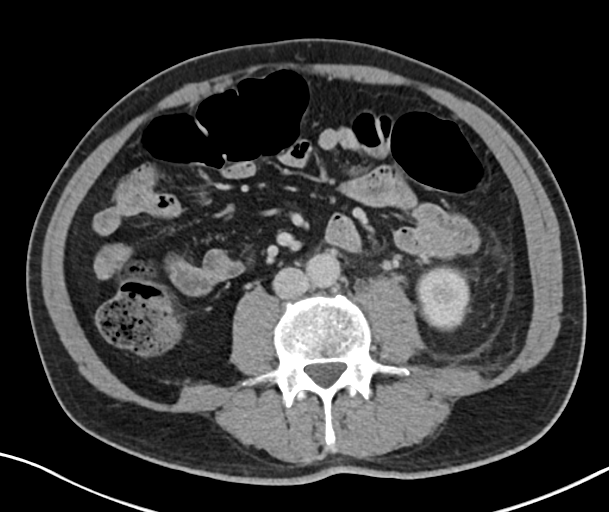
[im 11/39  soft-tissue]
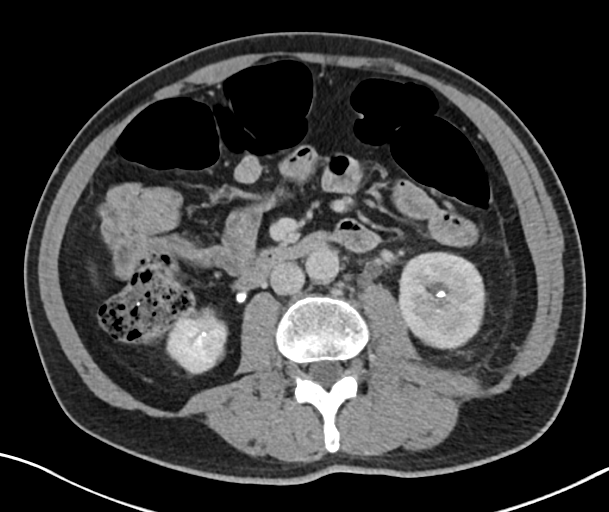
[im 15/39  soft-tissue]
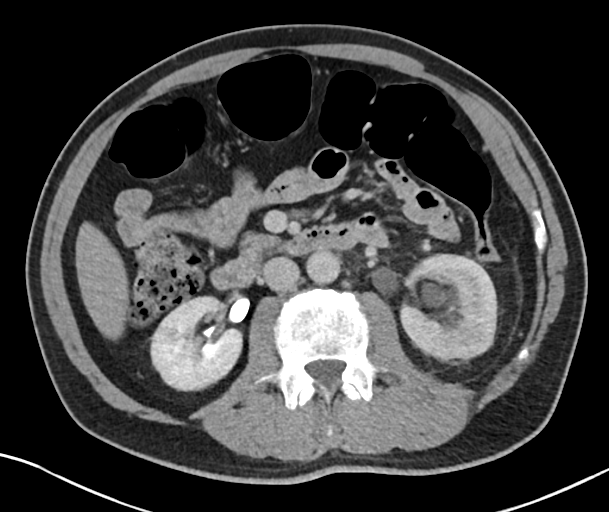
[im 19/39  soft-tissue]
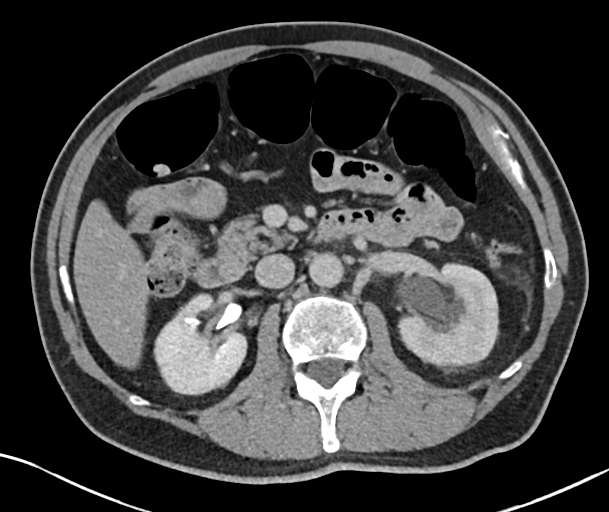
[im 21/39  soft-tissue]
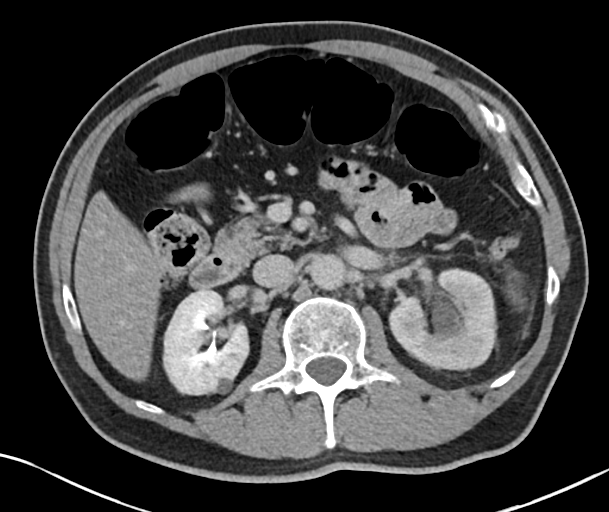
[im 25/39  soft-tissue]
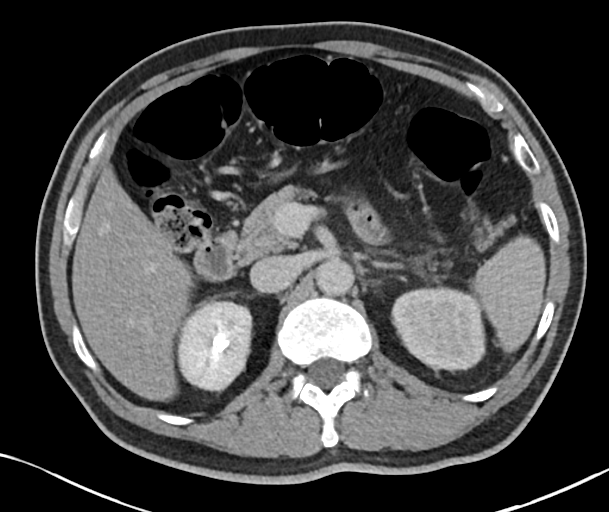
[im 29/39  soft-tissue]
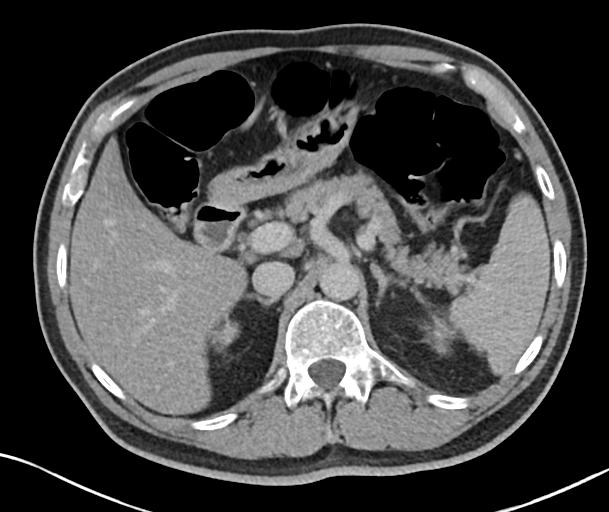
[im 33/39  soft-tissue]
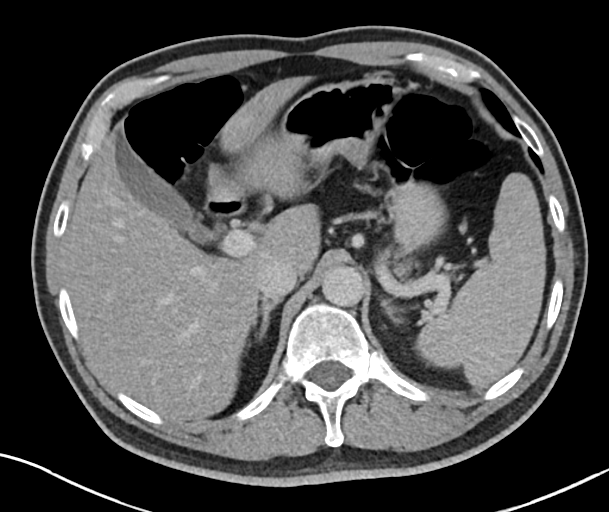
[im 33/39  bone]
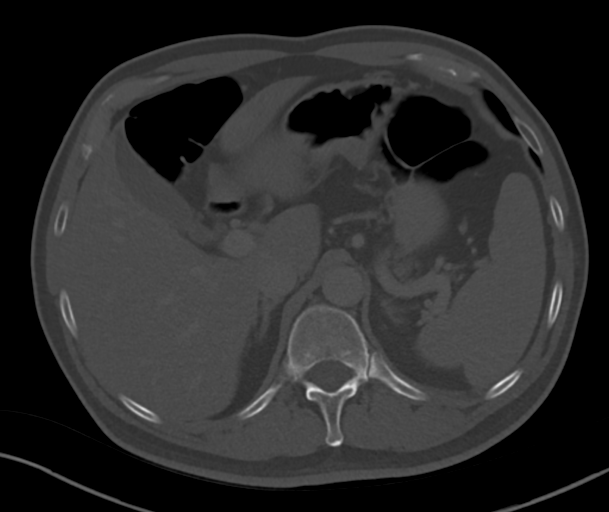
[im 37/39  soft-tissue]
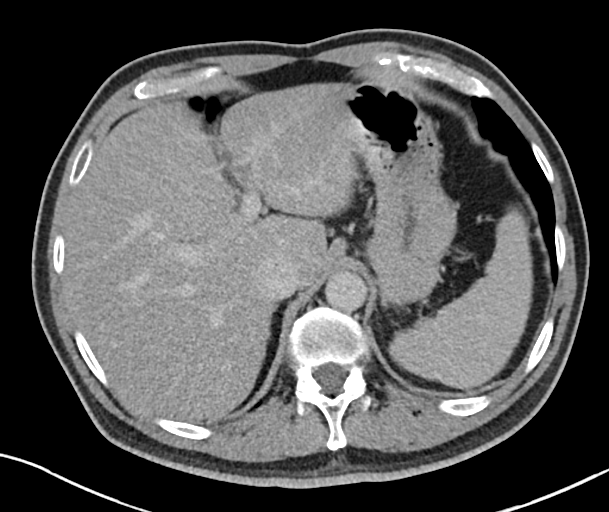

[13 of 46 positions shown; findings below may reference images not displayed]

FINDINGS: Lower chest: Mild bibasilar linear atelectasis or scarring. Normal
sized heart.

Hepatobiliary: No focal liver abnormality is seen. No gallstones,
gallbladder wall thickening, or biliary dilatation.

Pancreas: Unremarkable. No pancreatic ductal dilatation or
surrounding inflammatory changes.

Spleen: Interval visualization of a 6 mm rounded area of low density
in the superior aspect of the spleen. The spleen is normal in size
and shape.

Adrenals/Urinary Tract: Normal appearing adrenal glands. Bilateral
renal calculi. The largest is in the lower pole of the left kidney,
measuring 7 mm. Small bilateral renal cysts. Moderate dilatation of
the left renal collecting system and ureter the level of the distal
ureter at the ureterovesical junction. There are 2 calculi in ureter
at that location, 1 measuring 5 mm in the other measures 4 mm. No
dilatation of the right ureter or renal collecting system. No
significant urine in the bladder with no bladder calculi visualized.
There is a urethral cuff with associated tubing and reservoir.

Stomach/Bowel: The right colon is distended with stool in the
transverse colon is distended with gas. There are multiple proximal
descending colon diverticula without evidence of diverticulitis. No
obstructing mass is seen. Normal appearing appendix, small bowel and
stomach. Mild diffuse low density distal esophageal wall thickening.

Vascular/Lymphatic: Mild atheromatous arterial calcifications
without aneurysm. Mildly enlarged left para-aortic lymph node at the
level of the left kidney, with a short axis diameter of 10 mm on
image number 38 series 2, without significant change. Otherwise, no
enlarged lymph nodes are seen.

Reproductive: Surgically absent prostate gland with surgical clips.

Other: Small left inguinal hernia containing fat and small proximal
right inguinal hernia containing fat.

Musculoskeletal: Mild lumbar and lower thoracic spine degenerative
changes. Proximal right femoral bone islands. Small cysts in both
proximal femurs and right acetabulum. No evidence of bony metastatic
disease.
IMPRESSION: 1. Two distal left ureteral calculi at the ureterovesical junction,
causing moderate left hydronephrosis and hydroureter.
2. Multiple bilateral, nonobstructing renal calculi.
3. Single mildly enlarged left para-aortic lymph node. This is
unchanged since 07/08/2011, compatible with a mildly reactive node
rather than a metastatic node.
4. Mild diffuse low density distal esophageal wall thickening. This
could be due to reflux esophagitis.
5. Colonic diverticulosis and ileus.

## 2021-02-12 ENCOUNTER — Other Ambulatory Visit: Payer: Self-pay

## 2021-02-12 ENCOUNTER — Emergency Department (HOSPITAL_BASED_OUTPATIENT_CLINIC_OR_DEPARTMENT_OTHER)
Admission: EM | Admit: 2021-02-12 | Discharge: 2021-02-12 | Disposition: A | Discharge status: Home / Self Care | Payer: HMO | Attending: Emergency Medicine | Admitting: Emergency Medicine

## 2021-02-12 DIAGNOSIS — Z23 Encounter for immunization: Secondary | ICD-10-CM | POA: Diagnosis not present

## 2021-02-12 DIAGNOSIS — I10 Essential (primary) hypertension: Secondary | ICD-10-CM | POA: Insufficient documentation

## 2021-02-12 DIAGNOSIS — Z794 Long term (current) use of insulin: Secondary | ICD-10-CM | POA: Insufficient documentation

## 2021-02-12 DIAGNOSIS — W548XXA Other contact with dog, initial encounter: Secondary | ICD-10-CM | POA: Insufficient documentation

## 2021-02-12 DIAGNOSIS — E119 Type 2 diabetes mellitus without complications: Secondary | ICD-10-CM | POA: Insufficient documentation

## 2021-02-12 DIAGNOSIS — S61412A Laceration without foreign body of left hand, initial encounter: Secondary | ICD-10-CM | POA: Insufficient documentation

## 2021-02-12 DIAGNOSIS — Z79899 Other long term (current) drug therapy: Secondary | ICD-10-CM | POA: Diagnosis not present

## 2021-02-12 DIAGNOSIS — Z7984 Long term (current) use of oral hypoglycemic drugs: Secondary | ICD-10-CM | POA: Diagnosis not present

## 2021-02-12 DIAGNOSIS — S60512A Abrasion of left hand, initial encounter: Secondary | ICD-10-CM | POA: Diagnosis present

## 2021-02-12 MED ORDER — TETANUS-DIPHTH-ACELL PERTUSSIS 5-2.5-18.5 LF-MCG/0.5 IM SUSY
0.5000 mL | PREFILLED_SYRINGE | Freq: Once | INTRAMUSCULAR | Status: AC
  Administered 2021-02-12: 0.5 mL via INTRAMUSCULAR
  Filled 2021-02-12: qty 0.5

## 2021-02-12 NOTE — ED Notes (Signed)
Placed non stick dressing and wrapped in gauze

## 2021-02-12 NOTE — ED Provider Notes (Signed)
[facility] EMERGENCY DEPT Provider Note   CSN: [id number] Arrival date & time: 02/12/21  1638     History  Chief Complaint  Patient presents with   Abrasion    Nicholas Maldonado is a 71 y.o. male who presents to the emergency department complaining of left hand abrasion after his dog scratched him earlier this evening.  He is not on chronic anticoagulation.  His wife cleaned and bandaged the wound, but had difficulty stopping the bleeding and brought him to the emergency department.  HPI    Past Medical History:  Diagnosis Date   Diabetes mellitus    Has artificial bladder    Hypercholesterolemia    Hypertension      Home Medications Prior to Admission medications   Medication Sig Start Date End Date Taking? Authorizing Provider  ALPRAZolam (XANAX) 0.25 MG tablet Take 0.25 mg by mouth 2 (two) times daily as needed for anxiety. 11/28/17   [provider]  citalopram (CELEXA) 20 MG tablet Take 20 mg by mouth daily. 03/18/18   [provider]  famotidine (PEPCID) 20 MG tablet Take 20 mg by mouth daily.    [provider]  HYDROcodone-acetaminophen (NORCO/VICODIN) 5-325 MG tablet Take 2 tablets by mouth every 4 (four) hours as needed. Patient not taking: Reported on 06/13/2019 06/12/15   Tanna Furry, MD  insulin NPH-regular Human (70-30) 100 UNIT/ML injection Inject 12-18 Units into the skin 2 (two) times daily. Inject 12 units in the AM and 18 units in the PM    [provider]  lisinopril (PRINIVIL,ZESTRIL) 10 MG tablet Take 10 mg by mouth daily. 03/07/18   [provider]  metFORMIN (GLUCOPHAGE) 1000 MG tablet Take 1,000 mg by mouth 2 (two) times daily. 04/01/18   [provider]  Multiple Vitamin (MULITIVITAMIN WITH MINERALS) TABS Take 1 tablet by mouth daily.    [provider]  simvastatin (ZOCOR) 40 MG tablet Take 40 mg by mouth every evening. 01/31/18   [provider]  tamsulosin (FLOMAX) 0.4 MG  CAPS capsule Take 0.4 mg by mouth.    [provider]      Allergies    Levaquin [levofloxacin in d5w], Percocet [oxycodone-acetaminophen], and Terbinafine hcl    Review of Systems   Review of Systems  Skin:  Positive for wound. Negative for color change.  All other systems reviewed and are negative.  Physical Exam Updated Vital Signs BP (!) 138/111    Pulse 67    Temp 97.6 F (36.4 C) (Oral)    Resp 20    Ht 6' (1.829 m)    Wt 81.6 kg    SpO2 100%    BMI 24.41 kg/m  Physical Exam Vitals and nursing note reviewed.  Constitutional:      Appearance: Normal appearance.  HENT:     Head: Normocephalic and atraumatic.  Eyes:     Conjunctiva/sclera: Conjunctivae normal.  Pulmonary:     Effort: Pulmonary effort is normal. No respiratory distress.  Skin:    General: Skin is warm and dry.     Comments: Two superficial abrasions/skin tears over the dorsum of the left hand, bleeding is controlled after Steri-Strip application.  Neurological:     Mental Status: He is alert.  Psychiatric:        Mood and Affect: Mood normal.        Behavior: Behavior normal.    ED Results / Procedures / Treatments   Labs (all labs ordered are listed, but only  abnormal results are displayed) Labs Reviewed - No data to display  EKG None  Radiology No results found.  Procedures Procedures    Medications Ordered in ED Medications  Tdap (BOOSTRIX) injection 0.5 mL (has no administration in time range)    ED Course/ Medical Decision Making/ A&P                           Medical Decision Making Patient is a 71 year old male who presents to the emergency department with abrasions over his left hand after his dog scratched him earlier this evening.  On exam patient has 2 separate abrasion/skin tears over the dorsum of his left hand, with no deeper structures visualized.  Bleeding is controlled after application of Steri-Strips.  This was placed in a nonstick dressing and wrapped in  gauze.  Patient is full range of motion of all digits of that hand, with good capillary refill.  Sensation intact.  Patient tolerated dressing well.  He is not requiring admission or inpatient treatment for his symptoms.  Tdap updated.  Will hold off on antibiotics at this this time and advised that he follow-up with his primary doctor.  Discussed reasons return to the emergency department, patient is agreeable to the plan.  Final Clinical Impression(s) / ED Diagnoses Final diagnoses:  Skin tear of hand without complication, left, initial encounter    Rx / DC Orders ED Discharge Orders     None      Portions of this report may have been transcribed using voice recognition software. Every effort was made to ensure accuracy; however, inadvertent computerized transcription errors may be present.    Estill Cotta 02/12/21 2012    Blanchie Dessert, MD 02/12/21 2337

## 2021-02-12 NOTE — Discharge Instructions (Addendum)
You were seen in the emergency department today for an abrasion.  We have dressed your wound and controlled the bleeding.  We have also updated your tetanus.  As we discussed continue to look out for signs of infection such as increased redness, tenderness, or any drainage of pus from the site.  It has been a pleasure seeing and caring for you today and I hope you start feeling better soon!

## 2021-02-12 NOTE — ED Triage Notes (Signed)
Pt states dog was in his lap and the dog heard the door bell and jumped out of lap scratching pt. On left hand.With his claws. Pt states pain is 2/10. Pt. States left had aches.

## 2021-02-12 NOTE — ED Notes (Signed)
Left posterior hand skin tears, bleeding controlled.  Cleaned with NS and approximated the torn skin and placed steri stips to secure skin flaps in place.  Will place non stick pads once PA has seen injury.

## 2021-03-09 DIAGNOSIS — L820 Inflamed seborrheic keratosis: Secondary | ICD-10-CM | POA: Diagnosis not present

## 2021-03-09 DIAGNOSIS — C44320 Squamous cell carcinoma of skin of unspecified parts of face: Secondary | ICD-10-CM | POA: Diagnosis not present

## 2021-03-30 DIAGNOSIS — R935 Abnormal findings on diagnostic imaging of other abdominal regions, including retroperitoneum: Secondary | ICD-10-CM | POA: Diagnosis not present

## 2021-03-30 DIAGNOSIS — Z79899 Other long term (current) drug therapy: Secondary | ICD-10-CM | POA: Diagnosis not present

## 2021-03-30 DIAGNOSIS — E11319 Type 2 diabetes mellitus with unspecified diabetic retinopathy without macular edema: Secondary | ICD-10-CM | POA: Diagnosis not present

## 2021-03-30 DIAGNOSIS — K219 Gastro-esophageal reflux disease without esophagitis: Secondary | ICD-10-CM | POA: Diagnosis not present

## 2021-03-30 DIAGNOSIS — E7800 Pure hypercholesterolemia, unspecified: Secondary | ICD-10-CM | POA: Diagnosis not present

## 2021-03-30 DIAGNOSIS — I10 Essential (primary) hypertension: Secondary | ICD-10-CM | POA: Diagnosis not present

## 2021-03-30 DIAGNOSIS — Z7984 Long term (current) use of oral hypoglycemic drugs: Secondary | ICD-10-CM | POA: Diagnosis not present

## 2021-03-30 DIAGNOSIS — Z0001 Encounter for general adult medical examination with abnormal findings: Secondary | ICD-10-CM | POA: Diagnosis not present

## 2021-03-30 DIAGNOSIS — F419 Anxiety disorder, unspecified: Secondary | ICD-10-CM | POA: Diagnosis not present

## 2021-03-30 DIAGNOSIS — C61 Malignant neoplasm of prostate: Secondary | ICD-10-CM | POA: Diagnosis not present

## 2021-03-30 DIAGNOSIS — N1831 Chronic kidney disease, stage 3a: Secondary | ICD-10-CM | POA: Diagnosis not present

## 2021-04-13 DIAGNOSIS — E1122 Type 2 diabetes mellitus with diabetic chronic kidney disease: Secondary | ICD-10-CM | POA: Diagnosis not present

## 2021-04-13 DIAGNOSIS — Z794 Long term (current) use of insulin: Secondary | ICD-10-CM | POA: Diagnosis not present

## 2021-04-13 DIAGNOSIS — Z23 Encounter for immunization: Secondary | ICD-10-CM | POA: Diagnosis not present

## 2021-04-21 DIAGNOSIS — Z86010 Personal history of colonic polyps: Secondary | ICD-10-CM | POA: Diagnosis not present

## 2021-04-21 DIAGNOSIS — D122 Benign neoplasm of ascending colon: Secondary | ICD-10-CM | POA: Diagnosis not present

## 2021-04-21 DIAGNOSIS — D123 Benign neoplasm of transverse colon: Secondary | ICD-10-CM | POA: Diagnosis not present

## 2021-04-21 DIAGNOSIS — K648 Other hemorrhoids: Secondary | ICD-10-CM | POA: Diagnosis not present

## 2021-04-25 DIAGNOSIS — E1122 Type 2 diabetes mellitus with diabetic chronic kidney disease: Secondary | ICD-10-CM | POA: Diagnosis not present

## 2021-04-26 DIAGNOSIS — D123 Benign neoplasm of transverse colon: Secondary | ICD-10-CM | POA: Diagnosis not present

## 2021-07-21 DIAGNOSIS — C61 Malignant neoplasm of prostate: Secondary | ICD-10-CM | POA: Diagnosis not present

## 2021-10-26 DIAGNOSIS — Z794 Long term (current) use of insulin: Secondary | ICD-10-CM | POA: Diagnosis not present

## 2021-10-26 DIAGNOSIS — E1122 Type 2 diabetes mellitus with diabetic chronic kidney disease: Secondary | ICD-10-CM | POA: Diagnosis not present

## 2022-01-03 ENCOUNTER — Ambulatory Visit
Admission: RE | Admit: 2022-01-03 | Discharge: 2022-01-03 | Disposition: A | Discharge status: Home / Self Care | Payer: HMO | Source: Ambulatory Visit | Attending: Family Medicine | Admitting: Family Medicine

## 2022-01-03 ENCOUNTER — Other Ambulatory Visit: Payer: Self-pay

## 2022-01-03 DIAGNOSIS — R053 Chronic cough: Secondary | ICD-10-CM

## 2022-02-16 DIAGNOSIS — H2513 Age-related nuclear cataract, bilateral: Secondary | ICD-10-CM | POA: Diagnosis not present

## 2022-02-16 DIAGNOSIS — E119 Type 2 diabetes mellitus without complications: Secondary | ICD-10-CM | POA: Diagnosis not present

## 2022-02-16 DIAGNOSIS — H43813 Vitreous degeneration, bilateral: Secondary | ICD-10-CM | POA: Diagnosis not present

## 2022-02-27 DIAGNOSIS — R002 Palpitations: Secondary | ICD-10-CM | POA: Diagnosis not present

## 2022-03-03 DIAGNOSIS — R002 Palpitations: Secondary | ICD-10-CM | POA: Diagnosis not present

## 2022-03-22 DIAGNOSIS — K219 Gastro-esophageal reflux disease without esophagitis: Secondary | ICD-10-CM | POA: Diagnosis not present

## 2022-03-24 DIAGNOSIS — I471 Supraventricular tachycardia, unspecified: Secondary | ICD-10-CM | POA: Diagnosis not present

## 2022-03-24 DIAGNOSIS — I498 Other specified cardiac arrhythmias: Secondary | ICD-10-CM | POA: Diagnosis not present

## 2022-04-06 DIAGNOSIS — Z0000 Encounter for general adult medical examination without abnormal findings: Secondary | ICD-10-CM | POA: Diagnosis not present

## 2022-04-06 DIAGNOSIS — M65331 Trigger finger, right middle finger: Secondary | ICD-10-CM | POA: Diagnosis not present

## 2022-04-06 DIAGNOSIS — R5383 Other fatigue: Secondary | ICD-10-CM | POA: Diagnosis not present

## 2022-04-06 DIAGNOSIS — E1165 Type 2 diabetes mellitus with hyperglycemia: Secondary | ICD-10-CM | POA: Diagnosis not present

## 2022-04-06 DIAGNOSIS — E7800 Pure hypercholesterolemia, unspecified: Secondary | ICD-10-CM | POA: Diagnosis not present

## 2022-04-06 DIAGNOSIS — R809 Proteinuria, unspecified: Secondary | ICD-10-CM | POA: Diagnosis not present

## 2022-04-06 DIAGNOSIS — E1122 Type 2 diabetes mellitus with diabetic chronic kidney disease: Secondary | ICD-10-CM | POA: Diagnosis not present

## 2022-04-06 DIAGNOSIS — I10 Essential (primary) hypertension: Secondary | ICD-10-CM | POA: Diagnosis not present

## 2022-04-06 DIAGNOSIS — C61 Malignant neoplasm of prostate: Secondary | ICD-10-CM | POA: Diagnosis not present

## 2022-04-17 DIAGNOSIS — R809 Proteinuria, unspecified: Secondary | ICD-10-CM | POA: Diagnosis not present

## 2022-04-25 ENCOUNTER — Ambulatory Visit: Payer: HMO | Admitting: Cardiology

## 2022-04-25 ENCOUNTER — Encounter: Payer: Self-pay | Admitting: Cardiology

## 2022-04-25 VITALS — BP 120/70 | HR 64 | Resp 16 | Ht 72.0 in | Wt 182.0 lb

## 2022-04-25 DIAGNOSIS — I4719 Other supraventricular tachycardia: Secondary | ICD-10-CM

## 2022-04-25 DIAGNOSIS — E119 Type 2 diabetes mellitus without complications: Secondary | ICD-10-CM | POA: Diagnosis not present

## 2022-04-25 DIAGNOSIS — R0681 Apnea, not elsewhere classified: Secondary | ICD-10-CM | POA: Diagnosis not present

## 2022-04-25 DIAGNOSIS — R5381 Other malaise: Secondary | ICD-10-CM

## 2022-04-25 DIAGNOSIS — E7800 Pure hypercholesterolemia, unspecified: Secondary | ICD-10-CM

## 2022-04-25 DIAGNOSIS — R002 Palpitations: Secondary | ICD-10-CM

## 2022-04-25 DIAGNOSIS — Z794 Long term (current) use of insulin: Secondary | ICD-10-CM

## 2022-04-25 DIAGNOSIS — R5383 Other fatigue: Secondary | ICD-10-CM | POA: Diagnosis not present

## 2022-04-25 NOTE — Progress Notes (Signed)
Primary Physician/Referring:  London Pepper, MD  Patient ID: Nicholas Maldonado, male    DOB: 07/19/1950, 72 y.o.   MRN: [id number]  Chief Complaint  Patient presents with   SVT   New Patient (Initial Visit)   HPI:    Nicholas Maldonado  is a 72 y.o. Caucasian male patient with long-term history of controlled diabetes mellitus type 2, hypercholesterolemia, otherwise no history of hypertension, who was referred to me for evaluation of brief episodes of SVT, patient had Zio patch monitoring performed which revealed 10 beats run of atrial tachycardia.  Except for occasional episodes of palpitations, patient states that he continues to be fairly physically active, does use a push more to do yard work without any limitations.  Does endorse to snoring and also wife endorses apneic episodes.  No dizziness or syncope.  No chest pain, dyspnea.  Past Medical History:  Diagnosis Date   Diabetes mellitus    Has artificial bladder    Hypercholesterolemia    Hypertension    Past Surgical History:  Procedure Laterality Date   aus  Artificial urinary sphincter   BRAIN SURGERY     CYSTOSCOPY/URETEROSCOPY/HOLMIUM LASER/STENT PLACEMENT Left 04/21/2018   Procedure: CYSTOSCOPY/URETEROSCOPY/HOLMIUM LASER/STENT PLACEMENT;  Surgeon: Alexis Frock, MD;  Location: [facility];  Service: Urology;  Laterality: Left;   KIDNEY SURGERY     PROSTATE SURGERY     History reviewed. No pertinent family history.  Social History   Tobacco Use   Smoking status: Former    Packs/day: 1.00    Years: 15.00    Additional pack years: 0.00    Total pack years: 15.00    Types: Cigarettes    Quit date: 1991    Years since quitting: 33.2   Smokeless tobacco: Never  Substance Use Topics   Alcohol use: Not Currently   Marital Status: Married  ROS  Review of Systems  Cardiovascular:  Negative for chest pain, dyspnea on exertion and leg swelling.   Objective      04/25/2022   11:20 AM 04/25/2022   11:18 AM 02/12/2021    8:00  PM  Vitals with BMI  Height  6\' 0"    Weight  182 lbs   BMI  [id number]   Systolic [id number] 0000000 0000000  Diastolic 70 75 [id number]  Pulse 64 74 67   SpO2: 97 %  Physical Exam Neck:     Vascular: No carotid bruit or JVD.  Cardiovascular:     Rate and Rhythm: Normal rate and regular rhythm.     Pulses: Intact distal pulses.     Heart sounds: Normal heart sounds. No murmur heard.    No gallop.  Pulmonary:     Effort: Pulmonary effort is normal.     Breath sounds: Normal breath sounds.  Abdominal:     General: Bowel sounds are normal.     Palpations: Abdomen is soft.  Musculoskeletal:     Right lower leg: No edema.     Left lower leg: No edema.     Medications and allergies   Allergies  Allergen Reactions   Levaquin [Levofloxacin In D5w] Nausea And Vomiting   Percocet [Oxycodone-Acetaminophen] Nausea And Vomiting   Terbinafine Hcl Other (See Comments)    High liver count     Medication list   Current Outpatient Medications:    aspirin EC 81 MG tablet, Take 81 mg by mouth once., Disp: , Rfl:    citalopram (CELEXA) 20 MG tablet, Take 20 mg by mouth daily.,  Disp: , Rfl:    insulin NPH-regular Human (70-30) 100 UNIT/ML injection, Inject 12-18 Units into the skin 2 (two) times daily. Inject 12 units in the AM and 18 units in the PM, Disp: , Rfl:    metFORMIN (GLUCOPHAGE) 1000 MG tablet, Take 1,000 mg by mouth 2 (two) times daily., Disp: , Rfl:    Multiple Vitamin (MULITIVITAMIN WITH MINERALS) TABS, Take 1 tablet by mouth daily., Disp: , Rfl:    simvastatin (ZOCOR) 40 MG tablet, Take 40 mg by mouth every evening., Disp: , Rfl:  Laboratory examination:   External labs:   Cholesterol, total 138.000 m 04/06/2022 HDL 47.000 mg 04/06/2022 LDL 65.000 mg 04/06/2022 Triglycerides 154.000 m 04/06/2022  A1C 7.000 % 02/03/2022 TSH 1.210 01/12/2022  Labs 04/13/2022:  Serum glucose 186 mg, BUN 16, creatinine 1.17, EGFR 66,, potassium 4.9, LFTs normal.  Vitamin D 36.5.  Labs 01/03/2022:  Hb  13.4/HCT 39.0, platelets 283, normal indicis.  Radiology:    Cardiac Studies:  NA  EKG:   EKG 04/25/2022: Normal sinus rhythm at the rate of 72 bpm, normal axis.  No evidence of ischemia.  Compared to PCP EKG dated 03/24/2022, no significant change.   Assessment     ICD-10-CM   1. Atrial tachycardia  I47.19 Ambulatory referral to Sleep Studies    PCV ECHOCARDIOGRAM COMPLETE    PCV CARDIAC STRESS TEST    2. Palpitations  R00.2     3. Witnessed episode of apnea  R06.81 Ambulatory referral to Sleep Studies    4. Malaise and fatigue  R53.81 EKG 12-Lead   R53.83 Ambulatory referral to Sleep Studies    PCV CARDIAC STRESS TEST    5. Type 2 diabetes mellitus without complication, with long-term current use of insulin (HCC)  E11.9    Z79.4     6. Pure hypercholesterolemia  E78.00        Orders Placed This Encounter  Procedures   Ambulatory referral to Sleep Studies    Referral Priority:   Routine    Referral Type:   Consultation    Referral Reason:   Specialty Services Required    Referred to Provider:   Elmarie Mainland, MD    Number of Visits Requested:   1   PCV CARDIAC STRESS TEST    Standing Status:   Future    Standing Expiration Date:   06/25/2022   EKG 12-Lead   PCV ECHOCARDIOGRAM COMPLETE    Standing Status:   Future    Standing Expiration Date:   04/25/2023    No orders of the defined types were placed in this encounter.   Medications Discontinued During This Encounter  Medication Reason   tamsulosin (FLOMAX) 0.4 MG CAPS capsule Patient Preference   HYDROcodone-acetaminophen (NORCO/VICODIN) 5-325 MG tablet Patient Preference   famotidine (PEPCID) 20 MG tablet Patient Preference   ALPRAZolam (XANAX) 0.25 MG tablet Patient Preference   lisinopril (PRINIVIL,ZESTRIL) 10 MG tablet Patient Preference     Recommendations:   Nicholas Maldonado is a 72 y.o. Caucasian male patient with long-term history of controlled diabetes mellitus type 2, hypercholesterolemia,  otherwise no history of hypertension, who was referred to me for evaluation of brief episodes of SVT, patient had Zio patch monitoring performed which revealed 10 beats run of atrial tachycardia.  1. Atrial tachycardia I will obtain external Zio patch monitoring that was performed, discussed with Dr. Buddy Duty, patient has had brief atrial tachycardia episodes and not atrial fibrillation.  These are not typical supraventricular tachycardia i.e.  PSVT.  Patient symptoms of marked fatigue, palpitations, witnessed apneic episodes and loud snoring all suggest he probably has a component of obstructive sleep apnea and possibly central sleep apnea.  2. Palpitations Palpitations occurring mostly at rest and not with necessarily exertional activity, most episodes are occurring at usual activity hence I suspect these are PACs and PVCs.  Do not suspect atrial fibrillation or complex ventricular arrhythmias.  No associated dizziness or syncope.  3. Witnessed episode of apnea Patient's wife present at the bedside, patient has had witnessed apneic episodes.  4. Malaise and fatigue Malaise and fatigue could be related to sleep apnea, if this is excluded, his testosterone levels need to be checked.  I will certainly keep Dr. Delrae Rend involved in the decision making.  I have referred Mr. Bertino for [facility] sleep medicine for evaluation of OSA and possibly central sleep apnea. His cardiovascular risk factors are well-controlled, normal blood pressure, he should be on an ACE inhibitor but was recently discontinued as he was complaining of fatigue, no significant change in his symptoms since discontinuing lisinopril.  5. Type 2 diabetes mellitus without complication, with long-term current use of insulin ([place]) In view of diabetes mellitus, although asymptomatic, I will schedule him for a routine treadmill exercise stress test.  His cardiac examination and vascular examination is normal.  6. Pure hypercholesterolemia He is  on simvastatin with excellent control of his lipids.  Overall his primary risk factors are well-controlled.  I would like to see him back in 3 months or sooner if problems.    Adrian Prows, MD, [facility] 04/25/2022, 12:20 PM Office: [phone]

## 2022-04-26 DIAGNOSIS — R351 Nocturia: Secondary | ICD-10-CM | POA: Diagnosis not present

## 2022-04-26 DIAGNOSIS — Z794 Long term (current) use of insulin: Secondary | ICD-10-CM | POA: Diagnosis not present

## 2022-04-26 DIAGNOSIS — E1122 Type 2 diabetes mellitus with diabetic chronic kidney disease: Secondary | ICD-10-CM | POA: Diagnosis not present

## 2022-04-26 DIAGNOSIS — Z9889 Other specified postprocedural states: Secondary | ICD-10-CM | POA: Diagnosis not present

## 2022-05-09 ENCOUNTER — Ambulatory Visit: Payer: HMO

## 2022-05-09 DIAGNOSIS — R5383 Other fatigue: Secondary | ICD-10-CM | POA: Diagnosis not present

## 2022-05-09 DIAGNOSIS — R5381 Other malaise: Secondary | ICD-10-CM

## 2022-05-09 DIAGNOSIS — I4719 Other supraventricular tachycardia: Secondary | ICD-10-CM | POA: Diagnosis not present

## 2022-05-09 NOTE — Progress Notes (Signed)
   Exercise treadmill stress test 05/09/2022: Exercise treadmill stress test performed using Bruce protocol.  Patient reached 7.3 METS, and 91% of age predicted maximum heart rate.  Exercise capacity was fair. No chest pain reported.  Normal heart rate and hemodynamic response. Stress EKG revealed no ischemic changes. No arrythmia noted. Low risk study.   Normal stress test, discuss on visit soon

## 2022-05-17 ENCOUNTER — Ambulatory Visit: Payer: HMO

## 2022-05-17 DIAGNOSIS — I4719 Other supraventricular tachycardia: Secondary | ICD-10-CM

## 2022-05-17 DIAGNOSIS — I10 Essential (primary) hypertension: Secondary | ICD-10-CM | POA: Diagnosis not present

## 2022-05-20 NOTE — Progress Notes (Signed)
Essentially normal echo

## 2022-05-23 NOTE — Progress Notes (Signed)
LMTCB

## 2022-05-24 NOTE — Progress Notes (Signed)
Called patient, NA, LMAM

## 2022-05-24 NOTE — Progress Notes (Signed)
Results given to patient. He verbalized understanding.

## 2022-06-01 DIAGNOSIS — M65331 Trigger finger, right middle finger: Secondary | ICD-10-CM | POA: Diagnosis not present

## 2022-06-19 DIAGNOSIS — X32XXXD Exposure to sunlight, subsequent encounter: Secondary | ICD-10-CM | POA: Diagnosis not present

## 2022-06-19 DIAGNOSIS — L570 Actinic keratosis: Secondary | ICD-10-CM | POA: Diagnosis not present

## 2022-06-19 DIAGNOSIS — Z85828 Personal history of other malignant neoplasm of skin: Secondary | ICD-10-CM | POA: Diagnosis not present

## 2022-06-19 DIAGNOSIS — Z08 Encounter for follow-up examination after completed treatment for malignant neoplasm: Secondary | ICD-10-CM | POA: Diagnosis not present

## 2022-06-19 DIAGNOSIS — L718 Other rosacea: Secondary | ICD-10-CM | POA: Diagnosis not present

## 2022-07-26 ENCOUNTER — Ambulatory Visit: Payer: HMO | Admitting: Cardiology

## 2022-07-28 DIAGNOSIS — Z9889 Other specified postprocedural states: Secondary | ICD-10-CM | POA: Diagnosis not present

## 2022-07-28 DIAGNOSIS — Z794 Long term (current) use of insulin: Secondary | ICD-10-CM | POA: Diagnosis not present

## 2022-07-28 DIAGNOSIS — E1122 Type 2 diabetes mellitus with diabetic chronic kidney disease: Secondary | ICD-10-CM | POA: Diagnosis not present

## 2022-07-31 DIAGNOSIS — I10 Essential (primary) hypertension: Secondary | ICD-10-CM | POA: Diagnosis not present

## 2022-07-31 DIAGNOSIS — G4733 Obstructive sleep apnea (adult) (pediatric): Secondary | ICD-10-CM | POA: Diagnosis not present

## 2022-08-01 ENCOUNTER — Ambulatory Visit: Payer: HMO | Admitting: Cardiology

## 2022-08-14 ENCOUNTER — Ambulatory Visit: Payer: HMO | Admitting: Cardiology

## 2022-08-14 ENCOUNTER — Encounter: Payer: Self-pay | Admitting: Cardiology

## 2022-08-14 VITALS — BP 130/66 | HR 68 | Ht 72.0 in | Wt 174.0 lb

## 2022-08-14 DIAGNOSIS — R5381 Other malaise: Secondary | ICD-10-CM

## 2022-08-14 DIAGNOSIS — I4719 Other supraventricular tachycardia: Secondary | ICD-10-CM | POA: Diagnosis not present

## 2022-08-14 DIAGNOSIS — R002 Palpitations: Secondary | ICD-10-CM

## 2022-08-14 DIAGNOSIS — R5383 Other fatigue: Secondary | ICD-10-CM | POA: Diagnosis not present

## 2022-08-14 NOTE — Progress Notes (Signed)
Primary Physician/Referring:  Farris Has, MD  Patient ID: Nicholas Maldonado, male    DOB: 1950/05/06, 72 y.o.   MRN: [id number]  Chief Complaint  Patient presents with   atrial tachycardia   Palpitations   Follow-up   HPI:    Nicholas Maldonado  is a 72 y.o. Caucasian male patient with long-term history of controlled diabetes mellitus type 2, hypercholesterolemia, otherwise no history of hypertension, who was referred to me for evaluation of brief episodes of SVT, patient had Zio patch monitoring performed which revealed 10 beats run of atrial tachycardia.  He presents for a 6-week office visit, no change in symptoms.  Except for occasional episodes of palpitations, patient states that he continues to be fairly physically active, does use a push more to do yard work without any limitations.  No dizziness or syncope.  No chest pain, dyspnea.  Sleep study has been ordered by his PCP.  Patient states that since last office visit he has started using electrolyte supplements due to frequent diarrhea and has noticed improvement in palpitations and also mild fatigue.  Past Medical History:  Diagnosis Date   Diabetes mellitus    Has artificial bladder    Hypercholesterolemia    Hypertension    Past Surgical History:  Procedure Laterality Date   aus  Artificial urinary sphincter   BRAIN SURGERY     CYSTOSCOPY/URETEROSCOPY/HOLMIUM LASER/STENT PLACEMENT Left 04/21/2018   Procedure: CYSTOSCOPY/URETEROSCOPY/HOLMIUM LASER/STENT PLACEMENT;  Surgeon: Nicholas Ache, MD;  Location: [facility];  Service: Urology;  Laterality: Left;   KIDNEY SURGERY     PROSTATE SURGERY     History reviewed. No pertinent family history.  Social History   Tobacco Use   Smoking status: Former    Packs/day: 1.00    Years: 15.00    Additional pack years: 0.00    Total pack years: 15.00    Types: Cigarettes    Quit date: 1991    Years since quitting: 33.5   Smokeless tobacco: Never  Substance Use Topics   Alcohol use:  Not Currently   Marital Status: Married  ROS  Review of Systems  Cardiovascular:  Negative for chest pain, dyspnea on exertion and leg swelling.   Objective      08/14/2022    1:08 PM 04/25/2022   11:20 AM 04/25/2022   11:18 AM  Vitals with BMI  Height 6\' 0"   6\' 0"   Weight 174 lbs  182 lbs  BMI 23.59  24.68  Systolic 130 120 409  Diastolic 66 70 75  Pulse 68 64 74   SpO2: 99 %  Physical Exam Neck:     Vascular: No carotid bruit or JVD.  Cardiovascular:     Rate and Rhythm: Normal rate and regular rhythm.     Pulses: Intact distal pulses.     Heart sounds: Normal heart sounds. No murmur heard.    No gallop.  Pulmonary:     Effort: Pulmonary effort is normal.     Breath sounds: Normal breath sounds.  Abdominal:     General: Bowel sounds are normal.     Palpations: Abdomen is soft.  Musculoskeletal:     Right lower leg: No edema.     Left lower leg: No edema.     Medications and allergies   Allergies  Allergen Reactions   Levaquin [Levofloxacin In D5w] Nausea And Vomiting   Percocet [Oxycodone-Acetaminophen] Nausea And Vomiting   Terbinafine Hcl Other (See Comments)    High liver count  Medication list   Current Outpatient Medications:    aspirin EC 81 MG tablet, Take 81 mg by mouth once., Disp: , Rfl:    citalopram (CELEXA) 20 MG tablet, Take 20 mg by mouth daily., Disp: , Rfl:    insulin NPH-regular Human (70-30) 100 UNIT/ML injection, Inject 12-18 Units into the skin 2 (two) times daily. Inject 12 units in the AM and 18 units in the PM, Disp: , Rfl:    metFORMIN (GLUCOPHAGE) 1000 MG tablet, Take 1,000 mg by mouth 2 (two) times daily. 08/14/22 Decrease Metformin ER 1000 mg to 1/2 daily, Disp: , Rfl:    simvastatin (ZOCOR) 40 MG tablet, Take 40 mg by mouth every evening., Disp: , Rfl:  Laboratory examination:   External labs:   Cholesterol, total 138.000 m 04/06/2022 HDL 47.000 mg 04/06/2022 LDL 65.000 mg 04/06/2022 Triglycerides 154.000 m  04/06/2022  A1C 7.000 % 02/03/2022 TSH 1.210 01/12/2022  Labs 04/13/2022:  Serum glucose 186 mg, BUN 16, creatinine 1.17, EGFR 66,, potassium 4.9, LFTs normal.  Vitamin D 36.5.  Labs 01/03/2022:  Hb 13.4/HCT 39.0, platelets 283, normal indicis.  Testosterone  188 - 684 ng/dL 1/61/0960 454    Radiology:    Cardiac Studies:   PCV ECHOCARDIOGRAM COMPLETE 05/17/2022  Narrative Echocardiogram 05/17/2022: Normal LV systolic function with visual EF 55-60%. Left ventricle cavity is normal in size. Normal left ventricular wall thickness. Normal global wall motion. Normal diastolic filling pattern, normal LAP. Calculated EF 56%. Trileaflet aortic valve with no regurgitation. Mild aortic valve leaflet calcification. Structurally normal tricuspid valve with trace regurgitation. No evidence of pulmonary hypertension. No prior available for comparison.    PCV CARDIAC STRESS TEST 05/09/2022  Narrative Exercise treadmill stress test 05/09/2022: Exercise treadmill stress test performed using Bruce protocol.  Patient reached 7.3 METS, and 91% of age predicted maximum heart rate.  Exercise capacity was fair. No chest pain reported.  Normal heart rate and hemodynamic response. Stress EKG revealed no ischemic changes. No arrythmia noted. Low risk study.    EKG:   EKG 08/14/2022: Normal sinus rhythm at the rate of 60 bpm, IVCD with slight upsloping QRS, probably within normal limits.  Single PAC.  Compared to 04/25/2022, no significant change.  Normal EKG.  Assessment     ICD-10-CM   1. Atrial tachycardia  I47.19 EKG 12-Lead    2. Palpitations  R00.2 EKG 12-Lead    3. Malaise and fatigue  R53.81    R53.83        Orders Placed This Encounter  Procedures   EKG 12-Lead    No orders of the defined types were placed in this encounter.   Medications Discontinued During This Encounter  Medication Reason   Multiple Vitamin (MULITIVITAMIN WITH MINERALS) TABS Patient Preference      Recommendations:   UGOCHUKWU Maldonado is a 72 y.o. Caucasian male patient with long-term history of controlled diabetes mellitus type 2, hypercholesterolemia, otherwise no history of hypertension, who was referred to me for evaluation of brief episodes of SVT, patient had Zio patch monitoring performed which revealed 10 beats run of atrial tachycardia.  1. Atrial tachycardia Patient symptoms of palpitations are related to PACs and brief atrial tachycardia, he has not had any sustained episodes.  Since last office visit, patient states that he is feeling little better.  I reviewed the results of the echocardiogram revealing no significant left atrial enlargement and no significant valvular abnormality and normal LVEF.  For now continued medical therapy is recommended with observation only.  Sleep study is pending. - EKG 12-Lead  2. Palpitations As dictated above.  Patient also states that since he has been on metformin, he has frequent episodes of loose bowel movements to the point where it interacts with his lifestyle.  Since last office visit he has also increased electrolyte supplements and since then has noticed improvement in palpitations and also fatigue.  For now advised him to decrease metformin from 1000 mg twice daily to 500 mg daily as he is also having episodes of hypoglycemia at night and to follow-up with his PCP regarding this.  - EKG 12-Lead  3. Malaise and fatigue I reviewed his labs, his testosterone levels are normal, suspect his sleep apnea may be the culprit for his generalized fatigue and frequent atrial arrhythmia in the form of PACs and brief atrial tachycardia.  Unless symptoms are persistent, no therapy is indicated with regard to atrial tachycardia and PACs but to treat underlying issues including fatigue and malaise.   Yates Decamp, MD, [facility] 08/14/2022, 1:40 PM Office: [phone]

## 2022-08-24 DIAGNOSIS — G4733 Obstructive sleep apnea (adult) (pediatric): Secondary | ICD-10-CM | POA: Diagnosis not present

## 2022-08-24 DIAGNOSIS — G4710 Hypersomnia, unspecified: Secondary | ICD-10-CM | POA: Diagnosis not present

## 2022-08-24 DIAGNOSIS — I10 Essential (primary) hypertension: Secondary | ICD-10-CM | POA: Diagnosis not present

## 2022-09-04 DIAGNOSIS — C61 Malignant neoplasm of prostate: Secondary | ICD-10-CM | POA: Diagnosis not present

## 2022-11-02 DIAGNOSIS — X32XXXD Exposure to sunlight, subsequent encounter: Secondary | ICD-10-CM | POA: Diagnosis not present

## 2022-11-02 DIAGNOSIS — Z08 Encounter for follow-up examination after completed treatment for malignant neoplasm: Secondary | ICD-10-CM | POA: Diagnosis not present

## 2022-11-02 DIAGNOSIS — Z85828 Personal history of other malignant neoplasm of skin: Secondary | ICD-10-CM | POA: Diagnosis not present

## 2022-11-02 DIAGNOSIS — L570 Actinic keratosis: Secondary | ICD-10-CM | POA: Diagnosis not present

## 2022-12-25 DIAGNOSIS — K4090 Unilateral inguinal hernia, without obstruction or gangrene, not specified as recurrent: Secondary | ICD-10-CM | POA: Diagnosis not present

## 2023-01-09 DIAGNOSIS — K4090 Unilateral inguinal hernia, without obstruction or gangrene, not specified as recurrent: Secondary | ICD-10-CM | POA: Diagnosis not present

## 2023-01-23 DIAGNOSIS — N390 Urinary tract infection, site not specified: Secondary | ICD-10-CM | POA: Diagnosis not present

## 2023-01-25 DIAGNOSIS — E1122 Type 2 diabetes mellitus with diabetic chronic kidney disease: Secondary | ICD-10-CM | POA: Diagnosis not present

## 2023-01-25 DIAGNOSIS — Z9889 Other specified postprocedural states: Secondary | ICD-10-CM | POA: Diagnosis not present

## 2023-01-25 DIAGNOSIS — Z794 Long term (current) use of insulin: Secondary | ICD-10-CM | POA: Diagnosis not present

## 2023-04-10 DIAGNOSIS — Z0000 Encounter for general adult medical examination without abnormal findings: Secondary | ICD-10-CM | POA: Diagnosis not present

## 2023-04-10 DIAGNOSIS — C61 Malignant neoplasm of prostate: Secondary | ICD-10-CM | POA: Diagnosis not present

## 2023-04-10 DIAGNOSIS — I10 Essential (primary) hypertension: Secondary | ICD-10-CM | POA: Diagnosis not present

## 2023-04-10 DIAGNOSIS — R809 Proteinuria, unspecified: Secondary | ICD-10-CM | POA: Diagnosis not present

## 2023-04-10 DIAGNOSIS — E7800 Pure hypercholesterolemia, unspecified: Secondary | ICD-10-CM | POA: Diagnosis not present

## 2023-04-10 DIAGNOSIS — E1122 Type 2 diabetes mellitus with diabetic chronic kidney disease: Secondary | ICD-10-CM | POA: Diagnosis not present

## 2023-06-11 DIAGNOSIS — H2511 Age-related nuclear cataract, right eye: Secondary | ICD-10-CM | POA: Diagnosis not present

## 2023-06-12 DIAGNOSIS — H25042 Posterior subcapsular polar age-related cataract, left eye: Secondary | ICD-10-CM | POA: Diagnosis not present

## 2023-06-12 DIAGNOSIS — H25012 Cortical age-related cataract, left eye: Secondary | ICD-10-CM | POA: Diagnosis not present

## 2023-06-12 DIAGNOSIS — H2512 Age-related nuclear cataract, left eye: Secondary | ICD-10-CM | POA: Diagnosis not present

## 2023-06-21 DIAGNOSIS — H2511 Age-related nuclear cataract, right eye: Secondary | ICD-10-CM | POA: Diagnosis not present

## 2023-07-09 DIAGNOSIS — H25012 Cortical age-related cataract, left eye: Secondary | ICD-10-CM | POA: Diagnosis not present

## 2023-07-09 DIAGNOSIS — H25042 Posterior subcapsular polar age-related cataract, left eye: Secondary | ICD-10-CM | POA: Diagnosis not present

## 2023-07-09 DIAGNOSIS — H2512 Age-related nuclear cataract, left eye: Secondary | ICD-10-CM | POA: Diagnosis not present

## 2023-07-10 DIAGNOSIS — Z961 Presence of intraocular lens: Secondary | ICD-10-CM | POA: Diagnosis not present

## 2023-07-10 DIAGNOSIS — H2512 Age-related nuclear cataract, left eye: Secondary | ICD-10-CM | POA: Diagnosis not present

## 2023-07-17 DIAGNOSIS — H2512 Age-related nuclear cataract, left eye: Secondary | ICD-10-CM | POA: Diagnosis not present

## 2023-07-17 DIAGNOSIS — Z961 Presence of intraocular lens: Secondary | ICD-10-CM | POA: Diagnosis not present

## 2023-07-27 DIAGNOSIS — Z9889 Other specified postprocedural states: Secondary | ICD-10-CM | POA: Diagnosis not present

## 2023-07-27 DIAGNOSIS — Z794 Long term (current) use of insulin: Secondary | ICD-10-CM | POA: Diagnosis not present

## 2023-07-27 DIAGNOSIS — E1122 Type 2 diabetes mellitus with diabetic chronic kidney disease: Secondary | ICD-10-CM | POA: Diagnosis not present

## 2023-08-27 DIAGNOSIS — C61 Malignant neoplasm of prostate: Secondary | ICD-10-CM | POA: Diagnosis not present

## 2023-10-17 DIAGNOSIS — M7541 Impingement syndrome of right shoulder: Secondary | ICD-10-CM | POA: Diagnosis not present

## 2023-10-17 DIAGNOSIS — M7551 Bursitis of right shoulder: Secondary | ICD-10-CM | POA: Diagnosis not present

## 2023-11-05 DIAGNOSIS — X32XXXD Exposure to sunlight, subsequent encounter: Secondary | ICD-10-CM | POA: Diagnosis not present

## 2023-11-05 DIAGNOSIS — L570 Actinic keratosis: Secondary | ICD-10-CM | POA: Diagnosis not present

## 2023-11-05 DIAGNOSIS — Z85828 Personal history of other malignant neoplasm of skin: Secondary | ICD-10-CM | POA: Diagnosis not present

## 2023-11-05 DIAGNOSIS — Z08 Encounter for follow-up examination after completed treatment for malignant neoplasm: Secondary | ICD-10-CM | POA: Diagnosis not present
